# Patient Record
Sex: Female | Born: 1975 | Race: White | Hispanic: No | Smoking: Former smoker
Health system: Southern US, Community
[De-identification: ages and names within clinical notes are randomized; demographics above are authoritative.]

## PROBLEM LIST (undated history)

## (undated) DIAGNOSIS — T7840XA Allergy, unspecified, initial encounter: Secondary | ICD-10-CM

## (undated) DIAGNOSIS — F419 Anxiety disorder, unspecified: Secondary | ICD-10-CM

## (undated) DIAGNOSIS — N390 Urinary tract infection, site not specified: Secondary | ICD-10-CM

## (undated) DIAGNOSIS — Z973 Presence of spectacles and contact lenses: Secondary | ICD-10-CM

## (undated) DIAGNOSIS — D649 Anemia, unspecified: Secondary | ICD-10-CM

## (undated) DIAGNOSIS — E669 Obesity, unspecified: Secondary | ICD-10-CM

## (undated) DIAGNOSIS — K219 Gastro-esophageal reflux disease without esophagitis: Secondary | ICD-10-CM

## (undated) DIAGNOSIS — F329 Major depressive disorder, single episode, unspecified: Secondary | ICD-10-CM

## (undated) DIAGNOSIS — M549 Dorsalgia, unspecified: Secondary | ICD-10-CM

## (undated) DIAGNOSIS — Z01419 Encounter for gynecological examination (general) (routine) without abnormal findings: Secondary | ICD-10-CM

## (undated) DIAGNOSIS — Q829 Congenital malformation of skin, unspecified: Secondary | ICD-10-CM

## (undated) DIAGNOSIS — G8929 Other chronic pain: Secondary | ICD-10-CM

## (undated) DIAGNOSIS — J302 Other seasonal allergic rhinitis: Secondary | ICD-10-CM

## (undated) DIAGNOSIS — Z975 Presence of (intrauterine) contraceptive device: Secondary | ICD-10-CM

## (undated) HISTORY — DX: Anemia, unspecified: D64.9

## (undated) HISTORY — DX: Major depressive disorder, single episode, unspecified: F32.9

## (undated) HISTORY — DX: Other seasonal allergic rhinitis: J30.2

## (undated) HISTORY — DX: Gastro-esophageal reflux disease without esophagitis: K21.9

## (undated) HISTORY — DX: Dorsalgia, unspecified: M54.9

## (undated) HISTORY — DX: Urinary tract infection, site not specified: N39.0

## (undated) HISTORY — PX: KNEE ARTHROSCOPY: SUR90

## (undated) HISTORY — DX: Congenital malformation of skin, unspecified: Q82.9

## (undated) HISTORY — DX: Presence of spectacles and contact lenses: Z97.3

## (undated) HISTORY — DX: Presence of (intrauterine) contraceptive device: Z97.5

## (undated) HISTORY — DX: Encounter for gynecological examination (general) (routine) without abnormal findings: Z01.419

## (undated) HISTORY — DX: Obesity, unspecified: E66.9

## (undated) HISTORY — DX: Anxiety disorder, unspecified: F41.9

## (undated) HISTORY — DX: Allergy, unspecified, initial encounter: T78.40XA

## (undated) HISTORY — DX: Other chronic pain: G89.29

---

## 2001-10-25 ENCOUNTER — Emergency Department (HOSPITAL_COMMUNITY): Admission: EM | Admit: 2001-10-25 | Discharge: 2001-10-25 | Payer: Self-pay | Admitting: Emergency Medicine

## 2007-05-12 ENCOUNTER — Ambulatory Visit (HOSPITAL_COMMUNITY): Admission: RE | Admit: 2007-05-12 | Discharge: 2007-05-12 | Payer: Self-pay | Admitting: Family Medicine

## 2007-05-12 IMAGING — US US OB COMP LESS 14 WK
1 series · 14 of 28 positions shown · non-contrast
Comparison: none

OBSTETRICAL ULTRASOUND:

 This ultrasound exam was performed in the [HOSPITAL] Ultrasound Department.  The OB US report was generated in the AS system, and faxed to the ordering physician.  This report is also available in [REDACTED] PACS.

[Series 1: us ob comp less 14 wk · 0.18mm/px · 14 of 44 slices shown]
[im 2/44]
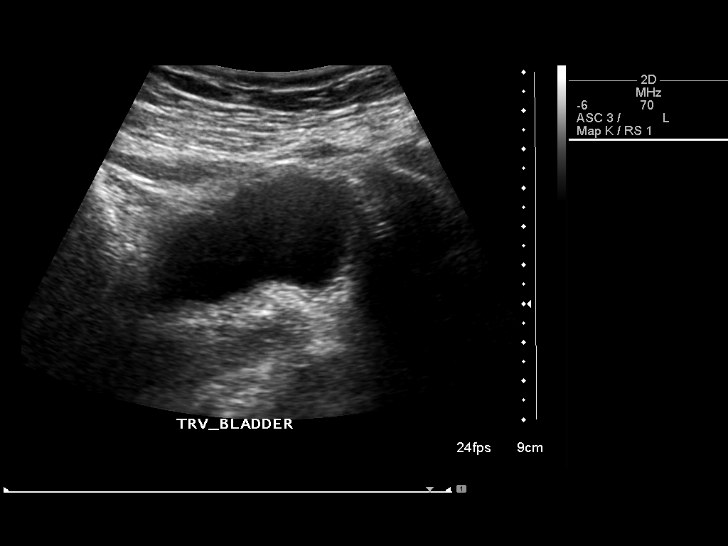
[im 5/44]
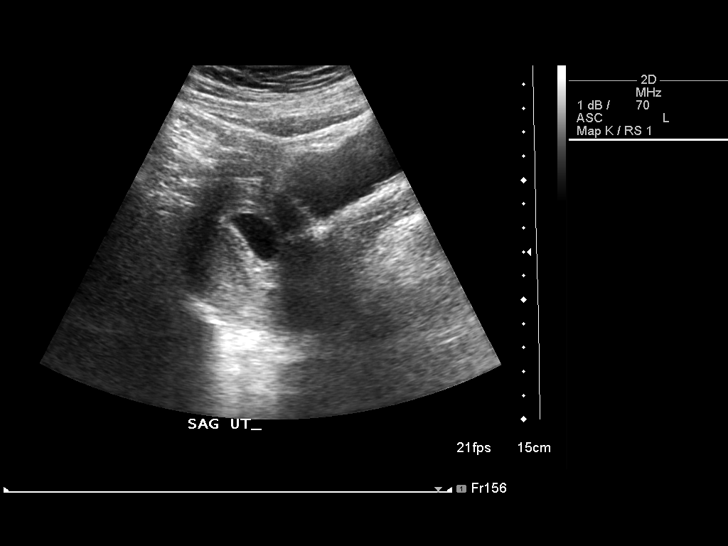
[im 8/44]
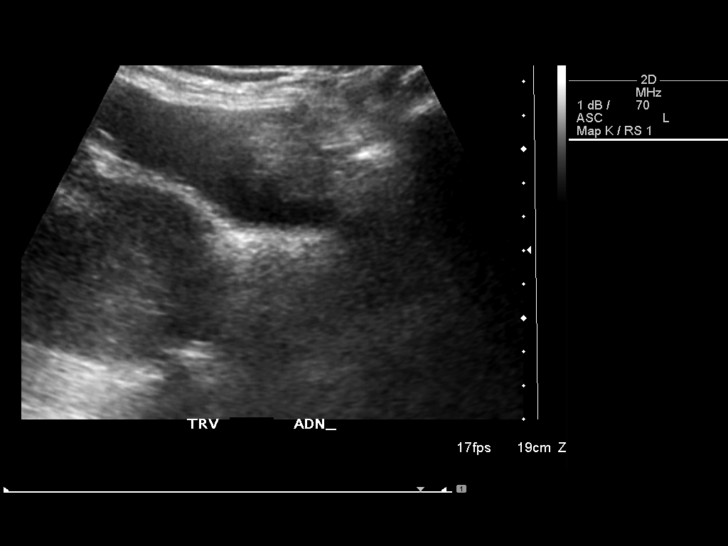
[im 12/44]
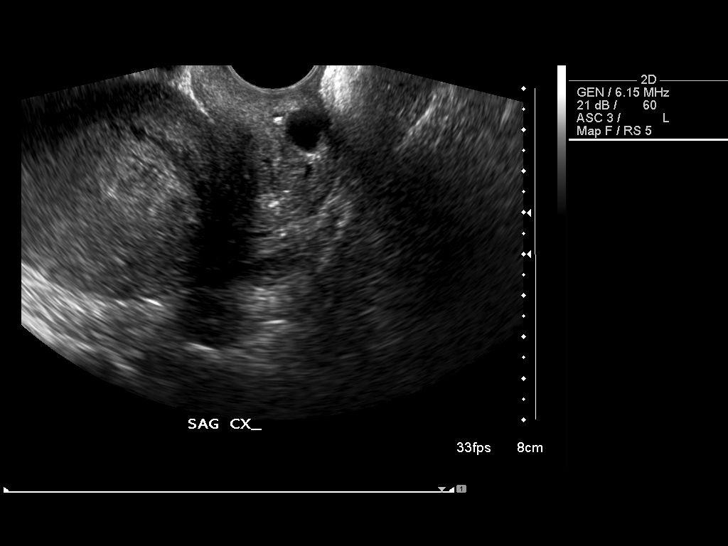
[im 15/44]
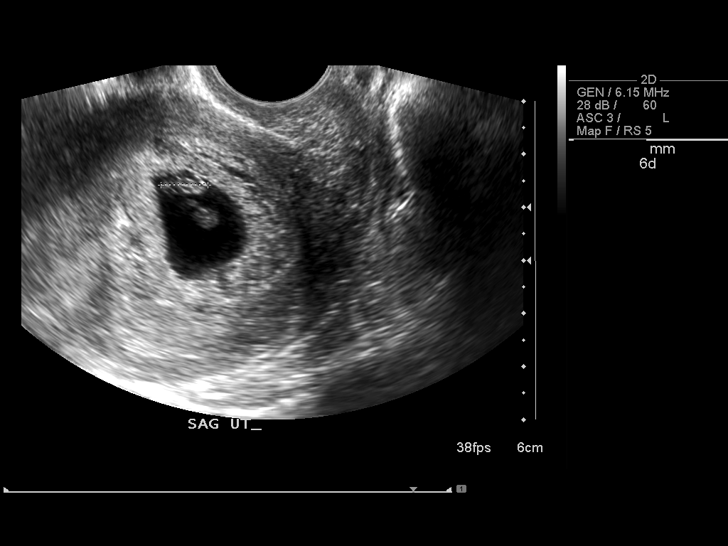
[im 18/44]
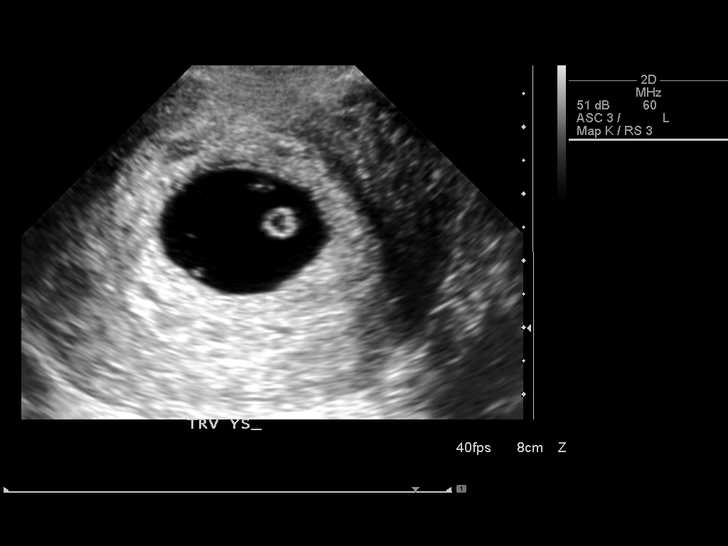
[im 21/44]
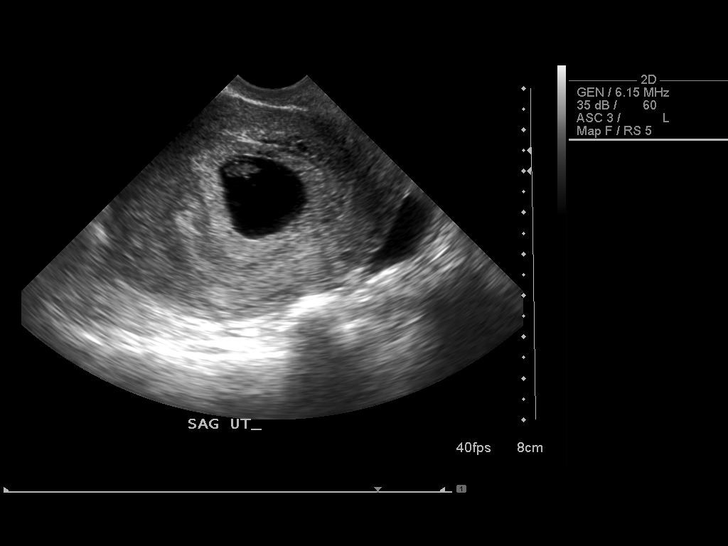
[im 24/44]
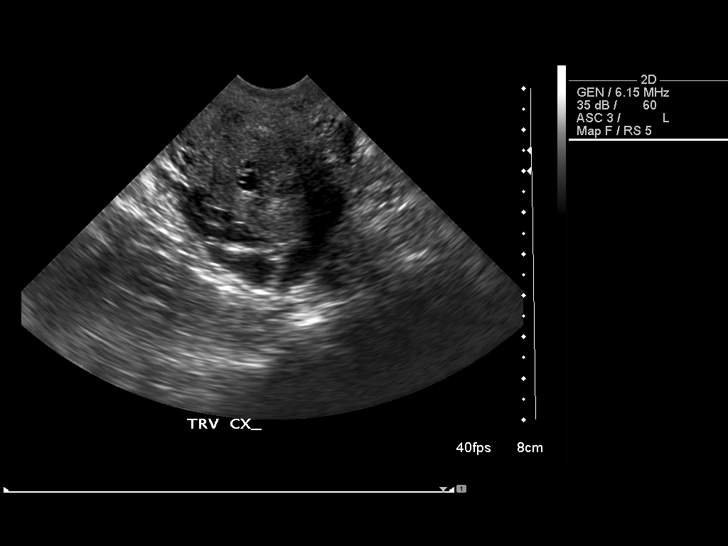
[im 28/44]
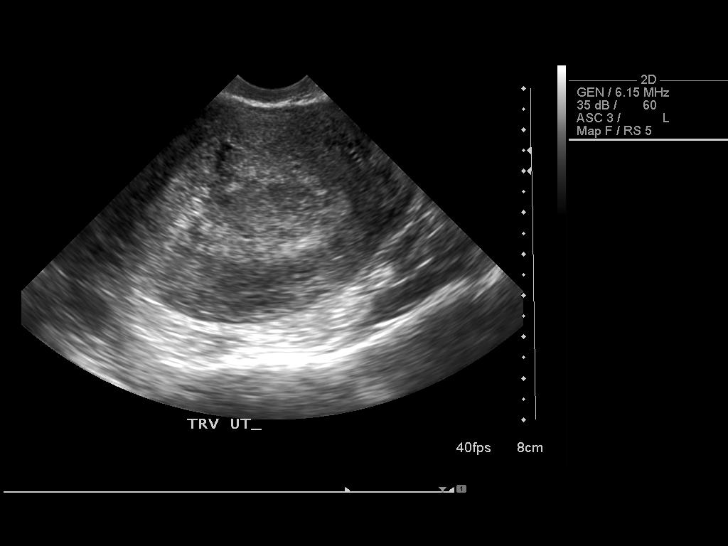
[im 31/44]
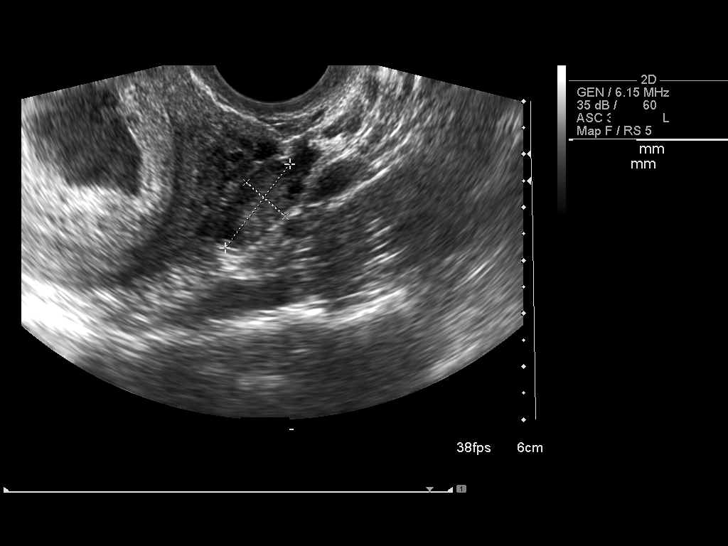
[im 34/44]
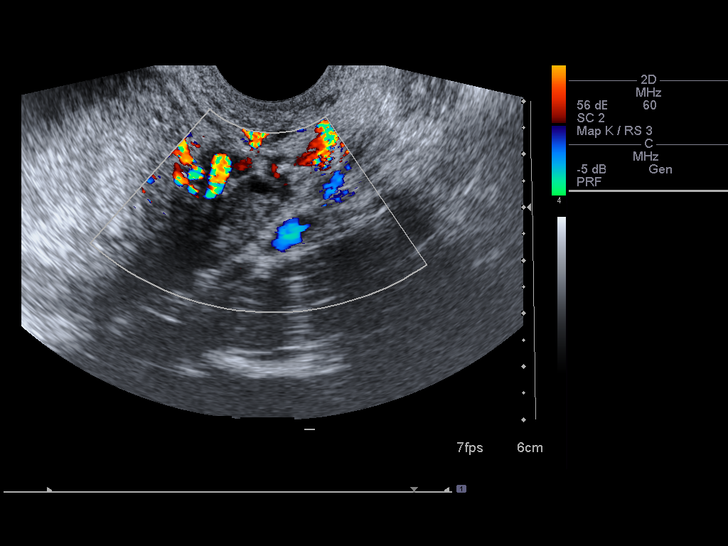
[im 37/44]
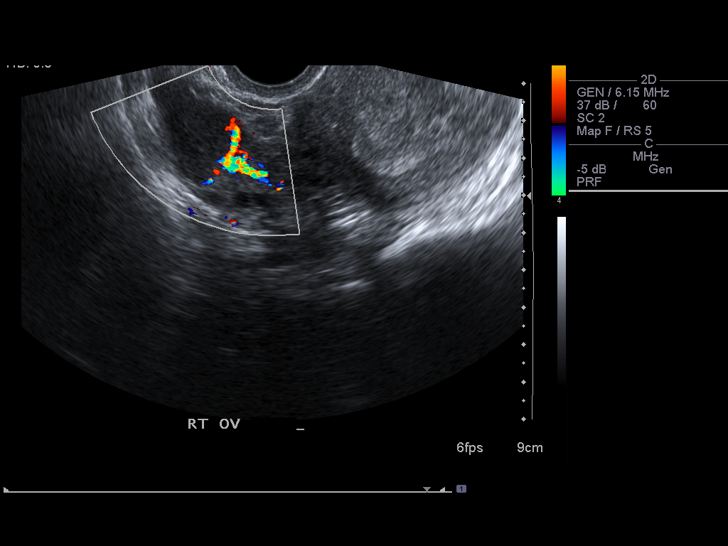
[im 40/44]
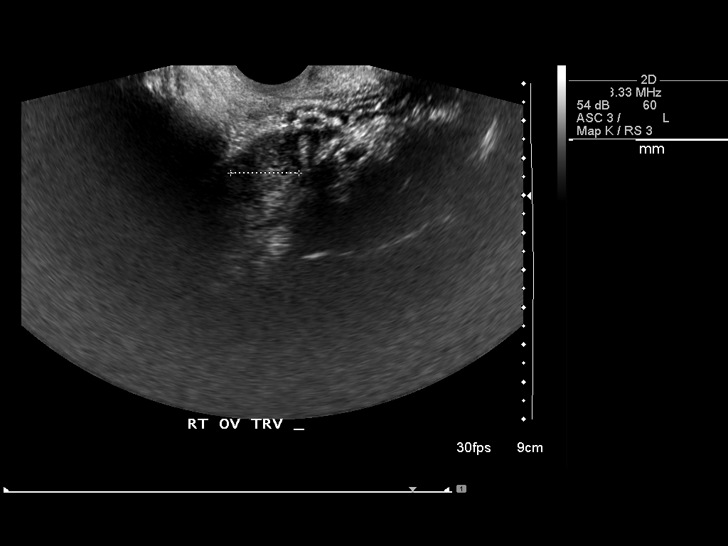
[im 44/44]
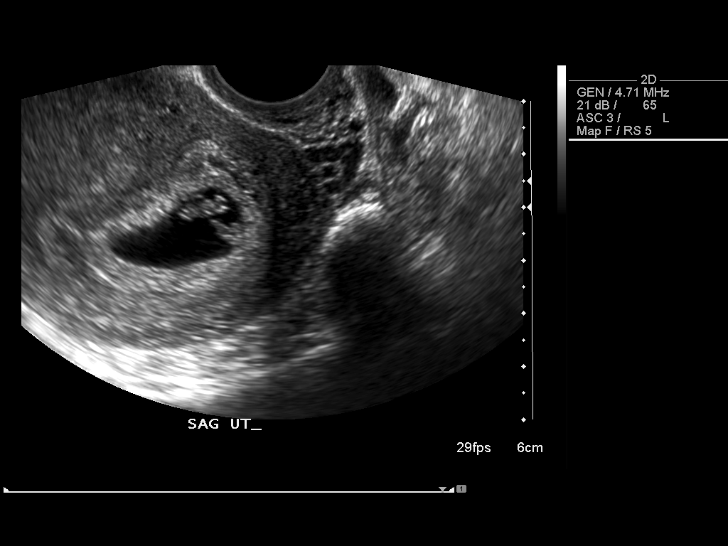

[14 of 28 positions shown; findings below may reference images not displayed]

IMPRESSION: See AS Obstetric US report.

## 2007-09-18 DIAGNOSIS — F32A Depression, unspecified: Secondary | ICD-10-CM

## 2007-09-18 HISTORY — DX: Depression, unspecified: F32.A

## 2008-01-13 ENCOUNTER — Inpatient Hospital Stay (HOSPITAL_COMMUNITY): Admission: RE | Admit: 2008-01-13 | Discharge: 2008-01-15 | Payer: Self-pay | Admitting: Obstetrics and Gynecology

## 2011-01-30 NOTE — Op Note (Signed)
NAMEKHRISTIAN, SEALS               ACCOUNT NO.:  [id number]   MEDICAL RECORD NO.:  [id number]          PATIENT TYPE:  INP   LOCATION:  9126                          FACILITY:  [facility]   PHYSICIAN:  Kendra H. Tenny Craw, MD     DATE OF BIRTH:  10/04/1975   DATE OF PROCEDURE:  01/13/2008  DATE OF DISCHARGE:                               OPERATIVE REPORT   PREOPERATIVE DIAGNOSES:  1. A 41 and 5-week intrauterine pregnancy.  2. Suspected macrosomia.  3. Unfavorable cervix.   POSTOPERATIVE DIAGNOSES:  1. A 41 and 5-week intrauterine pregnancy.  2. Confirmed macrosomia.  3. Unfavorable cervix.   PROCEDURE:  Primary low transverse cesarean section with a T-extension  of uterine incision and vacuum-assisted delivery of infant's head.   SURGEON:  Freddrick March. Tenny Craw, MD   ASSISTANT:  Lodema Hong, MD.   ANESTHESIA:  Spinal.   OPERATIVE FINDINGS:  Vigorous female infant in the vertex presentation  weighing 10 pounds, 1 ounce with Apgar scores of 8 at one and 9 at five  minutes.  Normal-appearing ovaries, tubes, and uterus.  A benign cyst of  Morgagni was noted on the left adnexa.   SPECIMEN:  Placenta.   DISPOSITION:  Placenta tube L&D for disposal.   ESTIMATED BLOOD LOSS:  700 mL.   IV FLUIDS:  2900 mL.   URINE OUTPUT:  100 mL of clear urine.   COMPLICATIONS:  None.   DETAILS OF PROCEDURE:  Lindsey Avila is a 35 year old G1 P0, who presents  today at 68 weeks and 5 days' estimated gestational age for primary low  transverse cesarean section, for suspected macrosomia, and unfavorable  cervix.  One week ago, she had an ultrasound in the office, which  demonstrated an estimated fetal weight of 3979 g or 8 pounds, 12 ounces  at 41 weeks.  At this time, her cervix was closed.  In a long discussion  about induction of labor versus primary cesarean section, the patient  elected to proceed with a primary cesarean section and this was  scheduled for today.  The risks, benefits, and alternatives of  primary  cesarean section versus labor induction were discussed.   Following the appropriate informed consent, the patient was brought to  the operating room where spinal anesthesia was administered and found to  be adequate.  She was placed in the dorsal supine position in a leftward  tilt, prepped and draped in the normal sterile fashion.  A scalpel was  used to make a Pfannenstiel skin incision, which was carried down  through the underlying layers of soft tissue to the fascia.  The fascia  was incised in the midline, fascial incision was extended laterally with  Mayo scissors.  The superior aspect of the fascial incision was grasped  with Kocher clamps x2 and tented up.  The underlying rectus muscle was  dissected off sharply with the electrocautery unit.  The same procedure  was repeated on the inferior aspect of the fascial incision.  The rectus  muscles were then separated in the midline.  The abdominal peritoneum  was identified, tented up, entered  sharply with the Metzenbaums, and the  incision was extended superiorly and inferiorly with good visualization  of the bladder.  The bladder blade was inserted.  The vesicouterine  peritoneum was identified, tented up, entered sharply with the  Metzenbaums.  The incision was then extended laterally with the  Metzenbaums, and the bladder flap was then created digitally.  The  bladder blade was then reinserted.  The scalpel was used to make a low  transverse incision on the uterus, which was extended laterally with  blunt dissection.  The vacuum was then used to assist with delivery of  the infant's head.  After two pop-offs, the uterine incision was  extended with bandage scissors and following this, the infant's head was  easily delivered followed by the body.  The infant was bulb suctioned on  the operative field and cried vigorously on the operative field.  Cord  was clamped and cut, and the infant was passed to the waiting   pediatricians.  The placenta was then spontaneously delivered.  The  uterus was exteriorized, cleared of all clot and debris.  Upon  inspection of the uterine incision, it was felt that the extension of  the uterine incision with the bandage scissors appeared to be a T.  The  T portion of the uterine incision was repaired with a #1 chromic in two  layers.  The low transverse incision was then repaired with #1 chromic  in a running locked fashion.  Given the thickness of the myometrium, a  single layer repair was performed as imbricating layer could not be  performed.  A 3-0 Vicryl was used to perform a baseball stitch closure  of the uterine serosa on the extended portion of the uterine incision.  The uterine incision was then inspected and found to be hemostatic.  The  ovaries and tubes were inspected and found to be within normal limits.  A benign cyst of Morgagni was noted on the left adnexa.  The uterus was  returned to the abdominal cavity.  The abdominal cavity was cleared of  all clot and debris.  The uterine incision was reinspected again,  confirmed to be hemostatic.  The abdominal peritoneum was then  reapproximated with 2-0 Vicryl in a running fashion.  The fascia was  closed with 0 looped PDS and the skin was closed with staples.  All  sponge, lap, and needle counts were correct x2.  The patient tolerated  the procedure well and was brought to the recovery room in stable  condition.  At the completion of this case, the patient was counseled  that given the T-extension of the incision, that all future babies  should be performed by cesarean section, and she should not undergo a  trial of labor.       Freddrick March. Tenny Craw, MD  Electronically Signed     KHR/MEDQ  D:  01/13/2008  T:  01/14/2008  Job:  [id number]

## 2011-01-30 NOTE — H&P (Signed)
NAMEMARIALIZ, Lindsey Avila               ACCOUNT NO.:  [id number]   MEDICAL RECORD NO.:  [id number]          PATIENT TYPE:  AMB   LOCATION:  SDC                           FACILITY:  [facility]   PHYSICIAN:  Kendra H. Tenny Craw, MD     DATE OF BIRTH:  1975-10-08   DATE OF ADMISSION:  DATE OF DISCHARGE:                              HISTORY & PHYSICAL   CHIEF COMPLAINT:  Post dates pregnancy, suspected macrosomia and  unfavorable cervix.   HISTORY OF PRESENT ILLNESS:  [name] is a 35 year old gravida 1, para  0 at 41 weeks and 5 days of pregnancy who presents for a primary low  transverse cesarean section for post-term pregnancy was suspected  macrosomia and an unfavorable cervix.  She underwent a transabdominal  ultrasound on January 07, 2008, for size greater than dates which  demonstrate an estimated fetal weight of 3979 grams or 8 pounds and 12  ounces and an amniotic fluid index of 14.2 cm.  Her cervix was not  dilated at that time, and NST was reactive at that time.  She was  approximately 40 weeks and 6 days on the date of that ultrasound.  In  long discussions with [name] regarding options of labor induction  versus primary cesarean section after the risks, benefits and  alternatives of each were discussed with her, she elected to proceed  with primary cesarean section, understanding that if she did happen to  go into labor spontaneously on her own prior to April 28, that she would  attempt a trial of labor.  She presents on January 13, 2007, for a  scheduled primary cesarean section.   PAST MEDICAL HISTORY:  Anemia.   PAST SURGICAL HISTORY:  1. Left knee arthroscopy.  2. Wisdom tooth removal.   PAST OBSTETRICAL HISTORY:  She is a gravida 1, para 0.   PAST GYNECOLOGY HISTORY:  Denies abnormalities with past smears or  sexually transmitted diseases.   SOCIAL HISTORY:  Negative x3.   FAMILY HISTORY:  She has an uncle with a history of an abnormally formed  arm.  No mental retardation.   No spina bifida.  No cystic fibrosis.  No  Jewish descent.   CURRENT MEDICATIONS:  Iron and prenatal vitamins.   ALLERGIES:  LATEX, PENICILLIN AND SULFA.   PRENATAL LABS:  Blood type A+, antibody negative, RPR nonreactive,  rubella immune, hepatitis B surface antigen negative, HIV negative.  First trimester genetic screen was within normal limits as was a second  trimester AFP.  One hour Glucola of 93.  Group B strep was negative.  Pap smear, gonorrhea and chlamydia were within normal limits and  negative the   PHYSICAL EXAMINATION:  VITAL SIGNS:  Weight 227 pounds, blood pressure  110/70, height 5 feet 6 inches, alert and oriented x3.  GENERAL:  No apparent distress.  ABDOMEN:  Soft, gravid, nontender, nondistended.  Fetal heart tones were  131 in the office.  Cervix was closed, thick and high.   ASSESSMENT AND PLAN:  [name] is a 35 year old G1, P0 who presents  for primary cesarean section for  suspected macrosomia post dates and an  unfavorable cervix.  1. Admit to the hospital.  2. Consent for primary cesarean section.  3. Obtain CBC, PT/PTT, INR type and screen prior to surgery.  4. Administer clindamycin 900 mg IV prior to skin incision.      Freddrick March. Tenny Craw, MD  Electronically Signed     KHR/MEDQ  D:  01/09/2008  T:  01/09/2008  Job:  [id number]

## 2011-02-02 NOTE — Discharge Summary (Signed)
Lindsey Avila, Lindsey Avila               ACCOUNT NO.:  [id number]   MEDICAL RECORD NO.:  [id number]          PATIENT TYPE:  INP   LOCATION:  9126                          FACILITY:  [facility]   PHYSICIAN:  Kendra H. Tenny Craw, MD     DATE OF BIRTH:  [birth year]   DATE OF ADMISSION:  01/13/2008  DATE OF DISCHARGE:  01/15/2008                               DISCHARGE SUMMARY   FINAL DIAGNOSES:  Intrauterine pregnancy at 41-5/7 weeks, macrosomia,  and unfavorable cervix.   PROCEDURE:  Primary low transverse cesarean section with T-extension of  uterine incision and vacuum-assisted delivery of infant's head.   SURGEON:  Freddrick March. Tenny Craw, MD.   ASSISTANT:  Dr. Lodema Hong.   COMPLICATIONS:  None.   This 35 year old, G1, P0, presents at 41-5/7 weeks' gestation secondary  to suspected macrosomia and unfavorable cervix.  The patient had an  ultrasound a week prior in the office showing an estimated fetal weight  of close to 4000 grams and at that time her cervix was closed and a long  discussion was held with the patient regarding her options and the  patient elected to proceed with a cesarean section secondary to these  findings.  The patient's antepartum course up to this point had been  uncomplicated.  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  She did have a negative  group B strep culture obtained in our office at 35 weeks, otherwise  uncomplicated antepartum course with no gestational diabetes.  The  patient was taken to the operating room on January 13, 2008, by Dr. Waynard Reeds where a primary low transverse cesarean section with a T-extension  was performed with the delivery of a 10-pound-1-ounce female infant with  Apgars of 8 and 9.  There was a benign cyst at around left adnexa,  otherwise delivery went without complications.  The patient's  postoperative course was benign without any significant fevers.  The  patient did want her little  boy circumcised prior to discharge, which  was performed.  The patient was felt ready for discharge on  postoperative day #2, was sent home on a regular diet, told to decrease  activities, told to continue her prenatal vitamins, was given Percocet 1-  2 every 4-6 hours as needed for her pain, was to follow up in our office  the next day for her staple removal.   LABS ON DISCHARGE:  The patient had a hemoglobin of 11.2, white blood  cell count of 9.8, and platelets of 110,000.     Leilani Able, P.A.-C.      Freddrick March. Tenny Craw, MD  Electronically Signed   MB/MEDQ  D:  02/05/2008  T:  02/06/2008  Job:  [id number]

## 2011-06-12 LAB — CBC
HCT: 31.4 — ABNORMAL LOW
MCHC: 35.1
MCV: 86.6
MCV: 86.8
Platelets: 110 — ABNORMAL LOW
RBC: 3.62 — ABNORMAL LOW
RDW: 14
WBC: 9.8

## 2011-06-12 LAB — CCBB MATERNAL DONOR DRAW

## 2011-06-12 LAB — PROTIME-INR
INR: 0.9
Prothrombin Time: 12.3

## 2011-06-12 LAB — ABO/RH: ABO/RH(D): A POS

## 2014-10-28 ENCOUNTER — Telehealth: Payer: Self-pay | Admitting: Medical

## 2014-10-28 ENCOUNTER — Encounter: Payer: Self-pay | Admitting: Medical

## 2014-10-28 ENCOUNTER — Ambulatory Visit (INDEPENDENT_AMBULATORY_CARE_PROVIDER_SITE_OTHER): Payer: 59 | Admitting: Medical

## 2014-10-28 ENCOUNTER — Telehealth: Payer: Self-pay | Admitting: Family Medicine

## 2014-10-28 VITALS — BP 110/70 | HR 68 | Temp 98.2°F | Resp 15 | Ht 66.0 in | Wt 204.0 lb

## 2014-10-28 DIAGNOSIS — Z7189 Other specified counseling: Secondary | ICD-10-CM

## 2014-10-28 DIAGNOSIS — Z0000 Encounter for general adult medical examination without abnormal findings: Secondary | ICD-10-CM

## 2014-10-28 DIAGNOSIS — G8929 Other chronic pain: Secondary | ICD-10-CM

## 2014-10-28 DIAGNOSIS — E669 Obesity, unspecified: Secondary | ICD-10-CM

## 2014-10-28 DIAGNOSIS — Z7185 Encounter for immunization safety counseling: Secondary | ICD-10-CM

## 2014-10-28 DIAGNOSIS — M549 Dorsalgia, unspecified: Secondary | ICD-10-CM

## 2014-10-28 DIAGNOSIS — L989 Disorder of the skin and subcutaneous tissue, unspecified: Secondary | ICD-10-CM

## 2014-10-28 LAB — POCT URINALYSIS DIPSTICK
BILIRUBIN UA: NEGATIVE
GLUCOSE UA: NEGATIVE
KETONES UA: NEGATIVE
LEUKOCYTES UA: NEGATIVE
Nitrite, UA: NEGATIVE
PH UA: 7
Protein, UA: NEGATIVE
RBC UA: NEGATIVE
SPEC GRAV UA: 1.02
Urobilinogen, UA: NEGATIVE

## 2014-10-28 NOTE — Telephone Encounter (Signed)
I would have her try Benadryl at bedtime for the next 2-3 wk if not already doing an allergy pill.

## 2014-10-28 NOTE — Telephone Encounter (Signed)
Pt states she forgot to mention at her appt today that she's had a sore throat for three weeks. She wants to know what can be done for this.

## 2014-10-28 NOTE — Telephone Encounter (Signed)
Notified patient.

## 2014-10-28 NOTE — Patient Instructions (Signed)
Thank you for giving me the opportunity to serve you today.   Your diagnoses today includes: Encounter Diagnoses  Name Primary?  . Encounter for health maintenance examination in adult Yes  . Obesity   . Chronic back pain   . Changing skin lesion   . Vaccine counseling     Specific recommendations today include See your eye doctor yearly for routine vision care. See your dentist yearly for routine dental care including hygiene visits twice yearly. See your gynecologist yearly for routine gynecological care. Exercise most days per week, preferably at least 30 minutes every day Get me contact info for the dermatologist you want to use Diet: Eat a healthy low fat diet, including fruits and vegetables If you eat meat, choose lean meats such as fish, [place], and chicken consume appropriate quantities of whole grains such as barley, oat meal, and whole grain bread Dairy can include low fat milk, yogurt, cottage cheese, other cheese in small quantities Limit or avoid fast food, fried foods, sweets, ice cream, alcohol, soda, and candy  Vaccine recommendations: Check insurance coverage for Tdap vaccine.  Please follow up pending labs  I have included other useful information below for your review.  Preventative Care for Adults - Female      MAINTAIN REGULAR HEALTH EXAMS:  A routine yearly physical is a good way to check in with your primary care provider about your health and preventive screening. It is also an opportunity to share updates about your health and any concerns you have, and receive a thorough all-over exam.   Most health insurance companies pay for at least some preventative services.  Check with your health plan for specific coverages.  WHAT PREVENTATIVE SERVICES DO WOMEN NEED?  Adult women should have their weight and blood pressure checked regularly.   Women age 65 and older should have their cholesterol levels checked regularly.  Women should be screened for  cervical cancer with a Pap smear and pelvic exam beginning at either age 57, or 3 years after they become sexually activity.    Breast cancer screening generally begins at age 83 with a mammogram and breast exam by your primary care provider.    Beginning at age 30 and continuing to age 42, women should be screened for colorectal cancer.  Certain people may need continued testing until age 52.  Updating vaccinations is part of preventative care.  Vaccinations help protect against diseases such as the flu.  Osteoporosis is a disease in which the bones lose minerals and strength as we age. Women ages 54 and over should discuss this with their caregivers, as should women after menopause who have other risk factors.  Lab tests are generally done as part of preventative care to screen for anemia and blood disorders, to screen for problems with the kidneys and liver, to screen for bladder problems, to check blood sugar, and to check your cholesterol level.  Preventative services generally include counseling about diet, exercise, avoiding tobacco, drugs, excessive alcohol consumption, and sexually transmitted infections.    GENERAL RECOMMENDATIONS FOR GOOD HEALTH:  Healthy diet:  Eat a variety of foods, including fruit, vegetables, animal or vegetable protein, such as meat, fish, chicken, and eggs, or beans, lentils, tofu, and grains, such as rice.  Drink plenty of water daily.  Decrease saturated fat in the diet, avoid lots of red meat, processed foods, sweets, fast foods, and fried foods.  Exercise:  Aerobic exercise helps maintain good heart health. At least 30-40 minutes of moderate-intensity  exercise is recommended. For example, a brisk walk that increases your heart rate and breathing. This should be done on most days of the week.   Find a type of exercise or a variety of exercises that you enjoy so that it becomes a part of your daily life.  Examples are running, walking, swimming, water  aerobics, and biking.  For motivation and support, explore group exercise such as aerobic class, spin class, Zumba, Yoga,or  martial arts, etc.    Set exercise goals for yourself, such as a certain weight goal, walk or run in a race such as a 5k walk/run.  Speak to your primary care provider about exercise goals.  Disease prevention:  If you smoke or chew tobacco, find out from your caregiver how to quit. It can literally save your life, no matter how long you have been a tobacco user. If you do not use tobacco, never begin.   Maintain a healthy diet and normal weight. Increased weight leads to problems with blood pressure and diabetes.   The Body Mass Index or BMI is a way of measuring how much of your body is fat. Having a BMI above 27 increases the risk of heart disease, diabetes, hypertension, stroke and other problems related to obesity. Your caregiver can help determine your BMI and based on it develop an exercise and dietary program to help you achieve or maintain this important measurement at a healthful level.  High blood pressure causes heart and blood vessel problems.  Persistent high blood pressure should be treated with medicine if weight loss and exercise do not work.   Fat and cholesterol leaves deposits in your arteries that can block them. This causes heart disease and vessel disease elsewhere in your body.  If your cholesterol is found to be high, or if you have heart disease or certain other medical conditions, then you may need to have your cholesterol monitored frequently and be treated with medication.   Ask if you should have a cardiac stress test if your history suggests this. A stress test is a test done on a treadmill that looks for heart disease. This test can find disease prior to there being a problem.  Menopause can be associated with physical symptoms and risks. Hormone replacement therapy is available to decrease these. You should talk to your caregiver about whether  starting or continuing to take hormones is right for you.   Osteoporosis is a disease in which the bones lose minerals and strength as we age. This can result in serious bone fractures. Risk of osteoporosis can be identified using a bone density scan. Women ages 69 and over should discuss this with their caregivers, as should women after menopause who have other risk factors. Ask your caregiver whether you should be taking a calcium supplement and Vitamin D, to reduce the rate of osteoporosis.   Avoid drinking alcohol in excess (more than two drinks per day).  Avoid use of street drugs. Do not share needles with anyone. Ask for professional help if you need assistance or instructions on stopping the use of alcohol, cigarettes, and/or drugs.  Brush your teeth twice a day with fluoride toothpaste, and floss once a day. Good oral hygiene prevents tooth decay and gum disease. The problems can be painful, unattractive, and can cause other health problems. Visit your dentist for a routine oral and dental check up and preventive care every 6-12 months.   Look at your skin regularly.  Use a mirror to look  at your back. Notify your caregivers of changes in moles, especially if there are changes in shapes, colors, a size larger than a pencil eraser, an irregular border, or development of new moles.  Safety:  Use seatbelts 100% of the time, whether driving or as a passenger.  Use safety devices such as hearing protection if you work in environments with loud noise or significant background noise.  Use safety glasses when doing any work that could send debris in to the eyes.  Use a helmet if you ride a bike or motorcycle.  Use appropriate safety gear for contact sports.  Talk to your caregiver about gun safety.  Use sunscreen with a SPF (or skin protection factor) of 15 or greater.  Lighter skinned people are at a greater risk of skin cancer. Don't forget to also wear sunglasses in order to protect your eyes from  too much damaging sunlight. Damaging sunlight can accelerate cataract formation.   Practice safe sex. Use condoms. Condoms are used for birth control and to help reduce the spread of sexually transmitted infections (or STIs).  Some of the STIs are gonorrhea (the clap), chlamydia, syphilis, trichomonas, herpes, HPV (human papilloma virus) and HIV (human immunodeficiency virus) which causes AIDS. The herpes, HIV and HPV are viral illnesses that have no cure. These can result in disability, cancer and death.   Keep carbon monoxide and smoke detectors in your home functioning at all times. Change the batteries every 6 months or use a model that plugs into the wall.   Vaccinations:  Stay up to date with your tetanus shots and other required immunizations. You should have a booster for tetanus every 10 years. Be sure to get your flu shot every year, since 5%-20% of the [place] population comes down with the flu. The flu vaccine changes each year, so being vaccinated once is not enough. Get your shot in the fall, before the flu season peaks.   Other vaccines to consider:  Human Papilloma Virus or HPV causes cancer of the cervix, and other infections that can be transmitted from person to person. There is a vaccine for HPV, and females should get immunized between the ages of 16 and 74. It requires a series of 3 shots.   Pneumococcal vaccine to protect against certain types of pneumonia.  This is normally recommended for adults age 66 or older.  However, adults younger than 41 years old with certain underlying conditions such as diabetes, heart or lung disease should also receive the vaccine.  Shingles vaccine to protect against Varicella Zoster if you are older than age 2, or younger than 41 years old with certain underlying illness.  Hepatitis A vaccine to protect against a form of infection of the liver by a virus acquired from food.  Hepatitis B vaccine to protect against a form of infection of the  liver by a virus acquired from blood or body fluids, particularly if you work in health care.  If you plan to travel internationally, check with your local health department for specific vaccination recommendations.  Cancer Screening:  Breast cancer screening is essential to preventive care for women. All women age 9 and older should perform a breast self-exam every month. At age 10 and older, women should have their caregiver complete a breast exam each year. Women at ages 52 and older should have a mammogram (x-ray film) of the breasts. Your caregiver can discuss how often you need mammograms.    Cervical cancer screening includes taking a Pap  smear (sample of cells examined under a microscope) from the cervix (end of the uterus). It also includes testing for HPV (Human Papilloma Virus, which can cause cervical cancer). Screening and a pelvic exam should begin at age 41, or 3 years after a woman becomes sexually active. Screening should occur every year, with a Pap smear but no HPV testing, up to age 41. After age 41, you should have a Pap smear every 3 years with HPV testing, if no HPV was found previously.   Most routine colon cancer screening begins at the age of 41. On a yearly basis, doctors may provide special easy to use take-home tests to check for hidden blood in the stool. Sigmoidoscopy or colonoscopy can detect the earliest forms of colon cancer and is life saving. These tests use a small camera at the end of a tube to directly examine the colon. Speak to your caregiver about this at age 41, when routine screening begins (and is repeated every 5 years unless early forms of pre-cancerous polyps or small growths are found).

## 2014-10-28 NOTE — Telephone Encounter (Signed)
Changed pcp [facility]

## 2014-10-28 NOTE — Addendum Note (Signed)
Addended by: Lilli LightLOMAX, Livi Mcgann G on: 10/28/2014 12:14 PM   Modules accepted: Orders

## 2014-10-28 NOTE — Progress Notes (Signed)
Subjective:   HPI  Lindsey Avila is a 39 y.o. female who presents for a complete physical.  New patient today.  I saw her boyfriend recently for cough as a new patient.  Medical care team includes:  [facility]endra Ross [facility]  Optometry  Kristian CoveyShane Aaira Oestreicher, PA-C primary care establishing here today   Preventative care: Last ophthalmology visit:2015, [facility] Last dental visit:years ago dentist retired Last colonoscopy:never Last mammogram:2015 Last gynecological exam: 07/2014 Dr Tenny Crawoss, up to date on pap  Last ZOX:WRUEAEKG:never Last labs:years ago  Prior vaccinations: TD or Tdap:unsure? [facility] [place]? Influenza:2015 Pneumococcal:no Shingles/Zostavax:no  Advanced directive:yes Health care power of attorney:yes Living will:yes  Concerns: Can't seem to lose weight despite efforts over the years .  Eats healthy, exercising 4 days per week.   has been on appetite suppressant once before without side effects.  Doesn't sleep well.  In general gets in the bed 11:30pm, awakes around 6am to get son off to school.  Snores some.  Not rested when she awakes.   Reviewed their medical, surgical, family, social, medication, and allergy history and updated chart as appropriate.  Past Medical History  Diagnosis Date  . Seasonal allergies   . Recurrent urinary tract infection     no prior urology consult  . Anemia     in remote past due to heavy periods  . IUD (intrauterine device) in place     Mirena, placed 02/2014 (second one)  . Skin anomaly     keratinosis  . Wears glasses   . Chronic back pain   . Obesity   . Depression 2009    prior with pregnancy  . Encounter for routine gynecological examination     Dr. Waynard ReedsKendra Ross    Past Surgical History  Procedure Laterality Date  . Knee arthroscopy      left    History   Social History  . Marital Status: Married    Spouse Name: N/A  . Number of Children: N/A  . Years of Education: N/A   Occupational History  . Not on file.    Social History Main Topics  . Smoking status: Never Smoker   . Smokeless tobacco: Not on file  . Alcohol Use: 1.8 oz/week    2 Glasses of wine, 1 Shots of liquor, 0 Standard drinks or equivalent per week  . Drug Use: No  . Sexual Activity: Not on file   Other Topics Concern  . Not on file   Social History Narrative   Lives with 6yo son, cat and dog.  Boyfriend x 1 year.   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  Exercise - 4 days per week, gym with treadmill, cardio, circuit training    Family History  Problem Relation Age of Onset  . Hypertension Mother   . Hypothyroidism Mother   . Obesity Mother   . Cancer Mother     basal cell  . Hypertension Father   . Cancer Father     basal cell  . Benign prostatic hyperplasia Father   . COPD Maternal Grandmother   . Asthma Maternal Grandmother   . Dementia Maternal Grandfather   . Cancer Paternal Grandmother     pancreatic  . Heart disease Paternal Grandfather   . Hypertension Paternal Grandfather   . Stroke Neg Hx   . Diabetes Neg Hx     No current outpatient prescriptions on file.  Allergies  Allergen Reactions  . Penicillins   . Sulfa Antibiotics  Review of Systems Constitutional: -fever, -chills, -sweats, -unexpected weight change, -decreased appetite, +fatigue Allergy: -sneezing, -itching, -congestion Dermatology: +changing moles, --rash, -lumps ENT: -runny nose, -ear pain, +sore throat, -hoarseness, -sinus pain, -teeth pain, - ringing in ears, -hearing loss, -nosebleeds Cardiology: -chest pain, -palpitations, -swelling, -difficulty breathing when lying flat, -waking up short of breath Respiratory: -cough, -shortness of breath, -difficulty breathing with exercise or exertion, -wheezing, -coughing up blood Gastroenterology: -abdominal pain, -nausea, -vomiting, -diarrhea, -constipation, -blood in stool, -changes in bowel movement, -difficulty swallowing or eating Hematology: -bleeding, -bruising   Musculoskeletal: +joint aches, -muscle aches, -joint swelling, +back pain, -neck pain, -cramping, -changes in gait Ophthalmology: denies vision changes, eye redness, itching, discharge Urology: -burning with urination, -difficulty urinating, -blood in urine, -urinary frequency, -urgency, -incontinence Neurology: -headache, -weakness, -tingling, -numbness, -memory loss, -falls, -dizziness Psychology: -depressed mood, -agitation, -sleep problems     Objective:   Physical Exam  BP 110/70 mmHg  Pulse 68  Temp(Src) 98.2 F (36.8 C) (Oral)  Resp 15  Ht  (1.676 m)  Wt 204 lb (92.534 kg)  BMI 32.94 kg/m2  General appearance: alert, no distress, WD/WN, obese white female Skin: tattoo left medial lower leg just proximal to ankle, left neck just under jaw anteriorly with 3-34mm raised erythematous lesion with some pearly coloration suggestive of possible BCC, few scattered macules, no other worrisome lesions HEENT: normocephalic, conjunctiva/corneas normal, sclerae anicteric, PERRLA, EOMi, nares patent, no discharge or erythema, pharynx normal Oral cavity: MMM, tongue normal, teeth in good repair Neck: supple, no lymphadenopathy, no thyromegaly, no masses, normal ROM, no bruits Chest: non tender, normal shape and expansion Heart: RRR, normal S1, S2, no murmurs Lungs: CTA bilaterally, no wheezes, rhonchi, or rales Abdomen: +bs, soft, non tender, non distended, no masses, no hepatomegaly, no splenomegaly, no bruits Back: non tender, normal ROM, no scoliosis Musculoskeletal: small surgical scars left knee,upper extremities non tender, no obvious deformity, normal ROM throughout, lower extremities non tender, no obvious deformity, normal ROM throughout Extremities: no edema, no cyanosis, no clubbing Pulses: 2+ symmetric, upper and lower extremities, normal cap refill Neurological: alert, oriented x 3, CN2-12 intact, strength normal upper extremities and lower extremities, sensation normal  throughout, DTRs 2+ throughout, no cerebellar signs, gait normal Psychiatric: normal affect, behavior normal, pleasant  Breast/gyn/rectal - deferred to gynecology    Assessment and Plan :    Encounter Diagnoses  Name Primary?  . Encounter for health maintenance examination in adult Yes  . Obesity   . Chronic back pain   . Changing skin lesion   . Vaccine counseling     Physical exam - discussed healthy lifestyle, diet, exercise, preventative care, vaccinations, and addressed their concerns.  Handout given. Obesity - discussed possibly using phentermine, risks/benefits, discussed diet, exercise, strategies to lose weight, good sleep hygiene.  May need to consider sleep apnea screen going forward.  Chronic back pain - discussed stretching, routine exercise, weight loss efforts Changing skin lesion - advised dermatology referral.  She will get [place] contact info for her parent's dermatologist Vaccine counseling - up to date on flu shot.  She will check insurance coverage regarding Tdap. Follow-up pending labs

## 2014-10-29 ENCOUNTER — Other Ambulatory Visit: Payer: Self-pay | Admitting: Medical

## 2014-10-29 LAB — TSH: TSH: 2.12 u[IU]/mL (ref 0.350–4.500)

## 2014-10-29 LAB — LIPID PANEL
CHOL/HDL RATIO: 3.2 ratio
CHOLESTEROL: 158 mg/dL (ref 0–200)
HDL: 49 mg/dL (ref >39)
LDL Cholesterol: 95 mg/dL (ref 0–99)
TRIGLYCERIDES: 69 mg/dL (ref <150)
VLDL: 14 mg/dL (ref 0–40)

## 2014-10-29 LAB — CBC
HEMATOCRIT: 41.7 % (ref 36.0–46.0)
HEMOGLOBIN: 13.9 g/dL (ref 12.0–15.0)
MCH: 28.5 pg (ref 26.0–34.0)
MCHC: 33.3 g/dL (ref 30.0–36.0)
MCV: 85.6 fL (ref 78.0–100.0)
MPV: 10.8 fL (ref 8.6–12.4)
PLATELETS: 205 10*3/uL (ref 150–400)
RBC: 4.87 MIL/uL (ref 3.87–5.11)
RDW: 13.7 % (ref 11.5–15.5)
WBC: 7.5 10*3/uL (ref 4.0–10.5)

## 2014-10-29 LAB — COMPREHENSIVE METABOLIC PANEL
ALK PHOS: 61 U/L (ref 39–117)
ALT: 29 U/L (ref 0–35)
AST: 43 U/L — ABNORMAL HIGH (ref 0–37)
Albumin: 4 g/dL (ref 3.5–5.2)
BILIRUBIN TOTAL: 0.6 mg/dL (ref 0.2–1.2)
BUN: 13 mg/dL (ref 6–23)
CO2: 29 mEq/L (ref 19–32)
CREATININE: 0.67 mg/dL (ref 0.50–1.10)
Calcium: 8.9 mg/dL (ref 8.4–10.5)
Chloride: 104 mEq/L (ref 96–112)
GLUCOSE: 85 mg/dL (ref 70–99)
Potassium: 4.4 mEq/L (ref 3.5–5.3)
SODIUM: 140 meq/L (ref 135–145)
Total Protein: 6.4 g/dL (ref 6.0–8.3)

## 2014-10-29 LAB — HEMOGLOBIN A1C
HEMOGLOBIN A1C: 4.9 % (ref <5.7)
MEAN PLASMA GLUCOSE: 94 mg/dL (ref <117)

## 2014-10-29 MED ORDER — PHENTERMINE-TOPIRAMATE ER 7.5-46 MG PO CP24: 1.0000 | ORAL_CAPSULE | ORAL | Status: DC

## 2014-10-29 MED ORDER — PHENTERMINE-TOPIRAMATE ER 3.75-23 MG PO CP24: 1.0000 | ORAL_CAPSULE | ORAL | Status: DC

## 2014-11-09 ENCOUNTER — Other Ambulatory Visit: Payer: Self-pay | Admitting: Medical

## 2014-11-09 ENCOUNTER — Telehealth: Payer: Self-pay | Admitting: Internal Medicine

## 2014-11-09 MED ORDER — PHENTERMINE HCL 37.5 MG PO TABS: 37.5000 mg | ORAL_TABLET | Freq: Every day | ORAL | Status: DC

## 2014-11-09 NOTE — Telephone Encounter (Signed)
Pt states that her qsymia gives her anger issues and would like to be switched to another med. [facility] on [address].    Pt also needs a referral through insurance for a dermatologist

## 2014-11-09 NOTE — Telephone Encounter (Signed)
Call in plain phentermine.  I spoke with pt in person when she was with her boyfriend at his appt this morning

## 2014-11-10 NOTE — Telephone Encounter (Signed)
I called out Phentermine to the [facility]

## 2015-01-13 ENCOUNTER — Telehealth: Payer: Self-pay

## 2015-01-13 NOTE — Telephone Encounter (Signed)
LM for patient to confirm dermatology appt 02/08/15 @ 10:20

## 2015-01-13 NOTE — Telephone Encounter (Signed)
Patient called inquiring about dermatology appointment. I scheduled appt with Dr Terri PiedraLupton 02/07/25 @ 10:20 be there 10:10. [facility] fax[facility] to [phone]

## 2015-01-14 NOTE — Telephone Encounter (Signed)
LMOM FOR THE PATIENT ABOUT HER APPOINTMENT DETAILS.

## 2015-07-11 ENCOUNTER — Telehealth: Payer: Self-pay

## 2015-07-11 NOTE — Telephone Encounter (Signed)
I would need to see her for appt before starting medication, and she will need to sign HIPPA on our end to allow me to talk to her counselor.   I assume she signed HIPPA on the counselor's end.

## 2015-07-11 NOTE — Telephone Encounter (Signed)
She wants you speak to her counselor about getting an anti depressant instead of her having to make an appt. She doesn't want to make an appt if she doesn't have to since money is an issue for her. Her counselor's name is Efraim KaufmannMelissa and her contact number is [phone].

## 2015-07-12 NOTE — Telephone Encounter (Signed)
Will call back to schedule appt when she has the money

## 2015-07-12 NOTE — Telephone Encounter (Signed)
LMTCB

## 2015-09-30 ENCOUNTER — Telehealth: Payer: Self-pay

## 2015-09-30 NOTE — Telephone Encounter (Signed)
[facility]  [phone]

## 2015-10-11 DIAGNOSIS — Z0279 Encounter for issue of other medical certificate: Secondary | ICD-10-CM

## 2016-07-25 DIAGNOSIS — Z23 Encounter for immunization: Secondary | ICD-10-CM | POA: Diagnosis not present

## 2016-11-08 DIAGNOSIS — Z6836 Body mass index (BMI) 36.0-36.9, adult: Secondary | ICD-10-CM | POA: Diagnosis not present

## 2016-11-08 DIAGNOSIS — Z13220 Encounter for screening for lipoid disorders: Secondary | ICD-10-CM | POA: Diagnosis not present

## 2016-11-08 DIAGNOSIS — Z130 Encounter for screening for diseases of the blood and blood-forming organs and certain disorders involving the immune mechanism: Secondary | ICD-10-CM | POA: Diagnosis not present

## 2016-11-08 DIAGNOSIS — Z01419 Encounter for gynecological examination (general) (routine) without abnormal findings: Secondary | ICD-10-CM | POA: Diagnosis not present

## 2016-11-08 DIAGNOSIS — Z124 Encounter for screening for malignant neoplasm of cervix: Secondary | ICD-10-CM | POA: Diagnosis not present

## 2016-11-08 DIAGNOSIS — Z1231 Encounter for screening mammogram for malignant neoplasm of breast: Secondary | ICD-10-CM | POA: Diagnosis not present

## 2016-11-08 DIAGNOSIS — Z1329 Encounter for screening for other suspected endocrine disorder: Secondary | ICD-10-CM | POA: Diagnosis not present

## 2016-11-09 LAB — HM MAMMOGRAPHY

## 2016-11-12 LAB — HM PAP SMEAR

## 2017-01-10 DIAGNOSIS — F39 Unspecified mood [affective] disorder: Secondary | ICD-10-CM | POA: Diagnosis not present

## 2017-01-10 DIAGNOSIS — Z6835 Body mass index (BMI) 35.0-35.9, adult: Secondary | ICD-10-CM | POA: Diagnosis not present

## 2017-02-02 DIAGNOSIS — B850 Pediculosis due to Pediculus humanus capitis: Secondary | ICD-10-CM | POA: Diagnosis not present

## 2017-08-23 ENCOUNTER — Encounter: Payer: Self-pay | Admitting: Medical

## 2017-08-23 ENCOUNTER — Ambulatory Visit (INDEPENDENT_AMBULATORY_CARE_PROVIDER_SITE_OTHER): Payer: BLUE CROSS/BLUE SHIELD | Admitting: Medical

## 2017-08-23 VITALS — HR 85 | Ht 65.0 in | Wt 232.6 lb

## 2017-08-23 DIAGNOSIS — Z0000 Encounter for general adult medical examination without abnormal findings: Secondary | ICD-10-CM

## 2017-08-23 DIAGNOSIS — E669 Obesity, unspecified: Secondary | ICD-10-CM | POA: Insufficient documentation

## 2017-08-23 DIAGNOSIS — R41840 Attention and concentration deficit: Secondary | ICD-10-CM | POA: Diagnosis not present

## 2017-08-23 DIAGNOSIS — R05 Cough: Secondary | ICD-10-CM | POA: Diagnosis not present

## 2017-08-23 DIAGNOSIS — F329 Major depressive disorder, single episode, unspecified: Secondary | ICD-10-CM | POA: Diagnosis not present

## 2017-08-23 DIAGNOSIS — R5383 Other fatigue: Secondary | ICD-10-CM | POA: Diagnosis not present

## 2017-08-23 DIAGNOSIS — Z23 Encounter for immunization: Secondary | ICD-10-CM | POA: Diagnosis not present

## 2017-08-23 DIAGNOSIS — R059 Cough, unspecified: Secondary | ICD-10-CM

## 2017-08-23 DIAGNOSIS — R4589 Other symptoms and signs involving emotional state: Secondary | ICD-10-CM

## 2017-08-23 LAB — POCT URINALYSIS DIP (PROADVANTAGE DEVICE)
Bilirubin, UA: NEGATIVE
GLUCOSE UA: NEGATIVE mg/dL
Ketones, POC UA: NEGATIVE mg/dL
Leukocytes, UA: NEGATIVE
NITRITE UA: NEGATIVE
PROTEIN UA: NEGATIVE mg/dL
RBC UA: NEGATIVE
Specific Gravity, Urine: 1.02
UUROB: NEGATIVE
pH, UA: 6 (ref 5.0–8.0)

## 2017-08-23 NOTE — Progress Notes (Signed)
Subjective:   HPI  Lindsey Avila is a 41 y.o. female who presents for physical.  Last visit here 2016. Chief Complaint  Patient presents with  . Annual Exam    physical, no pap    Medical care team includes: Virgen Belland, Kermit Balo, PA-C here for primary care Dentist Eye doctor Dr. Waynard Reeds, [facility]   Concerns: She notes slight cough for up to 2 months, jokingly calls it tuberculosis.   Sometimes no cough, some days often.  Cough is nonproductive.  Has had some congestion.   Has some sinus pressure.   No sore throat, no ear pain, no fever, no sneezing.  No SOB,no wheezing.   No chest pain.  Is having weight gain, has struggled with weight for years.  Can't focus on things of late, problematic for her work.   Hard for her to get things accomplished.  Can't seem to complete tasks, gets easily distracted.   This has been going on over a year.   Has seen accupuncutrist.     Having lethargy, no motivation to exercise, hard to get up and do anything.  If no work for the day, will sleep a lot or sit on the cough.   Never awakes rested, snores.  No witnessed apnea, no family hx/o sleep apnea.    Psychologist - depression.  Has always felt some depression for years, but a little worse this past year.  No specific trigger.  She discuss this with her gynecologist recently.  Was given 2 different medications to try.  She tried one and didn't tolerate it, and never picked the other one up.  Reviewed their medical, surgical, family, social, medication, and allergy history and updated chart as appropriate.  Past Medical History:  Diagnosis Date  . Anemia    in remote past due to heavy periods  . Chronic back pain   . Depression 2009   prior with pregnancy  . Encounter for routine gynecological examination    Dr. Waynard Reeds  . IUD (intrauterine device) in place    Mirena, placed 02/2014 (second one)  . Obesity   . Recurrent urinary tract infection    no prior urology consult  .  Seasonal allergies   . Skin anomaly    keratinosis  . Wears glasses     Past Surgical History:  Procedure Laterality Date  . KNEE ARTHROSCOPY     left    Social History   Socioeconomic History  . Marital status: Married    Spouse name: Not on file  . Number of children: Not on file  . Years of education: Not on file  . Highest education level: Not on file  Social Needs  . Financial resource strain: Not on file  . Food insecurity - worry: Not on file  . Food insecurity - inability: Not on file  . Transportation needs - medical: Not on file  . Transportation needs - non-medical: Not on file  Occupational History  . Not on file  Tobacco Use  . Smoking status: Never Smoker  . Smokeless tobacco: Never Used  Substance and Sexual Activity  . Alcohol use: Yes    Alcohol/week: 1.8 oz    Types: 2 Glasses of wine, 1 Shots of liquor per week  . Drug use: No  . Sexual activity: Not on file  Other Topics Concern  . Not on file  Social History Narrative   Lives with 9yo son, cat and dog.  Boyfriend.  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  Exercise - nothing currently but prior  with treadmill, cardio, circuit training.  06/2017    Family History  Problem Relation Age of Onset  . Hypertension Mother   . Hypothyroidism Mother   . Obesity Mother   . Macular degeneration Mother   . Cataracts Mother   . Hypertension Father   . Cancer Father        basal cell skin  . Benign prostatic hyperplasia Father   . COPD Maternal Grandmother   . Asthma Maternal Grandmother   . Dementia Maternal Grandfather   . Cancer Paternal Grandmother        pancreatic  . Heart disease Paternal Grandfather        MI  . Hypertension Paternal Grandfather   . Stroke Neg Hx   . Diabetes Neg Hx      Current Outpatient Medications:  .  phentermine (ADIPEX-P) 37.5 MG tablet, Take 1 tablet (37.5 mg total) by mouth daily before breakfast. (Patient not taking: Reported on 08/23/2017), Disp: 30  tablet, Rfl: 1  Allergies  Allergen Reactions  . Penicillins   . Sulfa Antibiotics     Review of Systems Constitutional: -fever, -chills, -sweats, +unexpected weight change, -decreased appetite, +fatigue Allergy: -sneezing, -itching, -congestion Dermatology: -changing moles, --rash, -lumps ENT: -runny nose, -ear pain, -sore throat, -hoarseness, +sinus pain, -teeth pain, - ringing in ears, -hearing loss, -nosebleeds Cardiology: -chest pain, -palpitations, -swelling, -difficulty breathing when lying flat, -waking up short of breath Respiratory: +cough, -shortness of breath, -difficulty breathing with exercise or exertion, -wheezing, -coughing up blood Gastroenterology: -abdominal pain, -nausea, -vomiting, -diarrhea, -constipation, -blood in stool, -changes in bowel movement, -difficulty swallowing or eating Hematology: -bleeding, -bruising  Musculoskeletal: -joint aches, -muscle aches, -joint swelling, -back pain, -neck pain, -cramping, -changes in gait Ophthalmology: denies vision changes, eye redness, itching, discharge Urology: -burning with urination, -difficulty urinating, -blood in urine, -urinary frequency, -urgency, -incontinence Neurology: -headache, -weakness, -tingling, -numbness, -memory loss, -falls, -dizziness Psychology: +depressed mood, -agitation, -sleep problems Breast/gyn: -breast tenderness, -discharge, -lumps, -vaginal discharge,- irregular periods, -heavy periods    Objective:   Pulse 85   Ht 5\' 5"  (1.651 m)   Wt 232 lb 9.6 oz (105.5 kg)   SpO2 97%   BMI 38.71 kg/m   General appearance: alert, no distress, WD/WN, Caucasian female Skin: no worrisome lesions HEENT: normocephalic, conjunctiva/corneas normal, sclerae anicteric, PERRLA, EOMi, nares patent, no discharge or erythema, pharynx normal Oral cavity: MMM, tongue normal, teeth normal Neck: supple, no lymphadenopathy, no thyromegaly, no masses, normal ROM, no bruits Chest: non tender, normal shape and  expansion Heart: RRR, normal S1, S2, no murmurs Lungs: CTA bilaterally, no wheezes, rhonchi, or rales Abdomen: +bs, soft, non tender, non distended, no masses, no hepatomegaly, no splenomegaly, no bruits Back: non tender, normal ROM, no scoliosis Musculoskeletal: upper extremities non tender, no obvious deformity, normal ROM throughout, lower extremities non tender, no obvious deformity, normal ROM throughout Extremities: no edema, no cyanosis, no clubbing Pulses: 2+ symmetric, upper and lower extremities, normal cap refill Neurological: alert, oriented x 3, CN2-12 intact, strength normal upper extremities and lower extremities, sensation normal throughout, DTRs 2+ throughout, no cerebellar signs, gait normal Psychiatric: normal affect, behavior normal, pleasant  Breast/gyn/rectal - deferred to gynecology     Assessment and Plan :    Encounter Diagnoses  Name Primary?  . Routine general medical examination at a health care facility Yes  . Obesity with serious comorbidity, unspecified classification, unspecified obesity type   .  Depressed mood   . Attention deficit   . Lethargy   . Cough   . Need for influenza vaccination   . Need for Tdap vaccination     Physical exam - discussed and counseled on healthy lifestyle, diet, exercise, preventative care, vaccinations, sick and well care, proper use of emergency dept and after hours care, and addressed their concerns.    Health screening: See your eye doctor yearly for routine vision care. See your dentist yearly for routine dental care including hygiene visits twice yearly.  Cancer screening Discussed and advised monthly self breast exams Discussed mammogram, advised mammogram q1-2 years Discussed pap smear recommendations.   Pap smear per gyn Discussed colonoscopy screening age 43yo unless higher risk for earlier screening  Vaccinations: Counseled on the influenza virus vaccine.  Vaccine information sheet given.  Influenza  vaccine given after consent obtained.  Counseled on the Tdap (tetanus, diptheria, and acellular pertussis) vaccine.  Vaccine information sheet given. Tdap vaccine given after consent obtained.   Separate significant chronic issues discussed: Cough -begin Mucinex DM or Benadryl nightly for the next 5 days, hydrate well, if not improving let me know.  If cough not resolved within the next 10-14 days then go for chest xray  Obesity - counseled on diet, exercise, setting goals.  She requested phentermine, consider medication options pending labs.    Depressed mood, lethargy - discussed differential, deconditioning, depression, vitamin deficiency, sleep apnea.   Consider sleep study, labs today, consider going back to counseling, consider medication.   Advised she begin daily multivitamin and B12 supplement.  Lindsey Avila was seen today for annual exam.  Diagnoses and all orders for this visit:  Routine general medical examination at a health care facility -     POCT Urinalysis DIP (Proadvantage Device) -     Comprehensive metabolic panel -     CBC -     Lipid panel -     Hemoglobin A1c -     TSH -     VITAMIN D 25 Hydroxy (Vit-D Deficiency, Fractures)  Obesity with serious comorbidity, unspecified classification, unspecified obesity type  Depressed mood -     TSH -     VITAMIN D 25 Hydroxy (Vit-D Deficiency, Fractures)  Attention deficit  Lethargy  Cough -     DG Chest 2 View; Future  Need for influenza vaccination  Need for Tdap vaccination   Follow-up pending labs, yearly for physical

## 2017-08-24 LAB — LIPID PANEL
CHOL/HDL RATIO: 2.8 (calc) (ref <5.0)
Cholesterol: 163 mg/dL (ref <200)
HDL: 59 mg/dL (ref >50)
LDL CHOLESTEROL (CALC): 91 mg/dL
NON-HDL CHOLESTEROL (CALC): 104 mg/dL (ref <130)
TRIGLYCERIDES: 50 mg/dL (ref <150)

## 2017-08-24 LAB — CBC
HEMATOCRIT: 42.6 % (ref 35.0–45.0)
Hemoglobin: 14.4 g/dL (ref 11.7–15.5)
MCH: 28.5 pg (ref 27.0–33.0)
MCHC: 33.8 g/dL (ref 32.0–36.0)
MCV: 84.4 fL (ref 80.0–100.0)
MPV: 11.5 fL (ref 7.5–12.5)
PLATELETS: 209 10*3/uL (ref 140–400)
RBC: 5.05 10*6/uL (ref 3.80–5.10)
RDW: 12.7 % (ref 11.0–15.0)
WBC: 6.8 10*3/uL (ref 3.8–10.8)

## 2017-08-24 LAB — COMPREHENSIVE METABOLIC PANEL
AG RATIO: 1.6 (calc) (ref 1.0–2.5)
ALT: 22 U/L (ref 6–29)
AST: 17 U/L (ref 10–30)
Albumin: 3.9 g/dL (ref 3.6–5.1)
Alkaline phosphatase (APISO): 61 U/L (ref 33–115)
BUN: 14 mg/dL (ref 7–25)
CO2: 27 mmol/L (ref 20–32)
CREATININE: 0.71 mg/dL (ref 0.50–1.10)
Calcium: 9.1 mg/dL (ref 8.6–10.2)
Chloride: 106 mmol/L (ref 98–110)
GLOBULIN: 2.5 g/dL (ref 1.9–3.7)
GLUCOSE: 91 mg/dL (ref 65–99)
Potassium: 4.6 mmol/L (ref 3.5–5.3)
SODIUM: 139 mmol/L (ref 135–146)
TOTAL PROTEIN: 6.4 g/dL (ref 6.1–8.1)
Total Bilirubin: 0.5 mg/dL (ref 0.2–1.2)

## 2017-08-24 LAB — HEMOGLOBIN A1C
HEMOGLOBIN A1C: 4.8 %{Hb} (ref <5.7)
Mean Plasma Glucose: 91 (calc)
eAG (mmol/L): 5 (calc)

## 2017-08-24 LAB — VITAMIN D 25 HYDROXY (VIT D DEFICIENCY, FRACTURES): VIT D 25 HYDROXY: 26 ng/mL — AB (ref 30–100)

## 2017-08-24 LAB — TSH: TSH: 2.6 m[IU]/L

## 2017-08-27 ENCOUNTER — Other Ambulatory Visit: Payer: Self-pay | Admitting: Medical

## 2017-08-27 MED ORDER — PHENTERMINE HCL 37.5 MG PO TABS: 37.5000 mg | ORAL_TABLET | Freq: Every day | ORAL | 0 refills | Status: DC

## 2017-08-28 ENCOUNTER — Other Ambulatory Visit: Payer: Self-pay | Admitting: Medical

## 2017-08-28 MED ORDER — VITAMIN D (ERGOCALCIFEROL) 1.25 MG (50000 UNIT) PO CAPS
50000.0000 [IU] | ORAL_CAPSULE | ORAL | 3 refills | Status: DC
Start: 2017-08-28 — End: 2018-09-18

## 2017-08-28 MED ORDER — NALTREXONE-BUPROPION HCL ER 8-90 MG PO TB12: 2.0000 | ORAL_TABLET | Freq: Two times a day (BID) | ORAL | 1 refills | Status: DC

## 2017-09-19 ENCOUNTER — Telehealth: Payer: Self-pay | Admitting: Medical

## 2017-09-19 NOTE — Telephone Encounter (Signed)
[facility]. Sending back for review.

## 2017-09-27 ENCOUNTER — Telehealth: Payer: Self-pay | Admitting: Medical

## 2017-09-27 NOTE — Telephone Encounter (Signed)
Pt called and stated that she has developed a rash. Its not all over just in random places.  She thinks it might be from Contrave. She took Benadryl last night but the rash has soon back up today under her breast. She has been on the higher dose for over a week and the rash just started yesterday. 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 Please advise pt at [phone].

## 2017-09-27 NOTE — Telephone Encounter (Signed)
STOP contrave  and come in next week for eval of the rash.

## 2017-09-30 ENCOUNTER — Encounter: Payer: Self-pay | Admitting: Medical

## 2017-09-30 ENCOUNTER — Ambulatory Visit (INDEPENDENT_AMBULATORY_CARE_PROVIDER_SITE_OTHER): Payer: BLUE CROSS/BLUE SHIELD | Admitting: Medical

## 2017-09-30 VITALS — BP 118/76 | HR 95 | Wt 234.2 lb

## 2017-09-30 DIAGNOSIS — E669 Obesity, unspecified: Secondary | ICD-10-CM | POA: Diagnosis not present

## 2017-09-30 DIAGNOSIS — L509 Urticaria, unspecified: Secondary | ICD-10-CM | POA: Diagnosis not present

## 2017-09-30 DIAGNOSIS — T7840XA Allergy, unspecified, initial encounter: Secondary | ICD-10-CM

## 2017-09-30 MED ORDER — EPINEPHRINE 0.3 MG/0.3ML IJ SOAJ: 0.3000 mg | Freq: Once | INTRAMUSCULAR | 0 refills | Status: AC

## 2017-09-30 MED ORDER — PHENTERMINE HCL 37.5 MG PO TABS
37.5000 mg | ORAL_TABLET | Freq: Every day | ORAL | 0 refills | Status: DC
Start: 2017-09-30 — End: 2020-06-06

## 2017-09-30 NOTE — Telephone Encounter (Signed)
Pt coming in today

## 2017-09-30 NOTE — Progress Notes (Signed)
  Subjective:  Lindsey Avila is a 42 y.o. female who presents for allergic reaction. Chief Complaint  Patient presents with  . Rash    rash/ hives  on her neck and arms , under  breast  started last thursday    Here for complaint of hives.  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  First noticed on back of neck and right arm, under breast, but today mainly back of neck and right forearm.   Rash is puffy, pink, itchy.     Started taking Contrave right after last visit here in December.   Has been on this about 5 weeks.   The second week on full dosing of Contrave (this past week) she started noticing hives.   No other triggers she was concerned about causing the hives.   No other recent new exposures, no new foods.   No SOB or wheezing.  Felt panicky when she saw hives, given hx/o anaphylaxis with antibiotics.    Stopped Contrave Friday morning, been using benadryl once daily.   Didn't take the benadryl today 25mg .   Took 50mg  Benadryl the first night she noticed hives     No other aggravating or relieving factors.    No other c/o.  The following portions of the patient's history were reviewed and updated as appropriate: allergies, current medications, past family history, past medical history, past social history, past surgical history and problem list.  ROS Otherwise as in subjective above   Objective: BP 118/76   Pulse 95   Wt 234 lb 3.2 oz (106.2 kg)   SpO2 99%   BMI 38.97 kg/m   General appearance: alert, no distress, WD/WN Skin: patches of pink urticarial lesions of posterior neck and right forearm Oral cavity: MMM, no lesions Neck: supple, no lymphadenopathy, no thyromegaly, no masses Heart: RRR, normal S1, S2, no murmurs Lungs: CTA bilaterally, no wheezes, rhonchi, or rales Pulses: 2+ radial pulses, 2+ pedal pulses,  normal cap refill Ext: no edema    Assessment: Encounter Diagnoses  Name Primary?  . Allergic reaction, initial encounter Yes  . Hives   . Obesity with serious comorbidity, unspecified classification, unspecified obesity type      Plan: Hives, allergic reaction-begin Benadryl at bedtime 25 mg the next few nights, 1/2 tablet of Benadryl during the day,.  She has already stopped Contrave.  Hydrate well.  Can use Cortaid topically if needed.  Prescribed EpiPen in the event of anaphylactic reaction.  We discussed this in proper use of EpiPen  In regards to obesity and once she has been hives free for at least a week or 2, she can use phentermine 1/2 tablet 3 days a week until she runs out. call if any worsening in the next few days  Continue to work on healthy diet and exercise  Follow up: if not improving in next 1-2 days, then otherwise 89mo

## 2018-01-17 ENCOUNTER — Encounter: Payer: Self-pay | Admitting: Medical

## 2018-02-07 DIAGNOSIS — Z124 Encounter for screening for malignant neoplasm of cervix: Secondary | ICD-10-CM | POA: Diagnosis not present

## 2018-02-07 DIAGNOSIS — Z6836 Body mass index (BMI) 36.0-36.9, adult: Secondary | ICD-10-CM | POA: Diagnosis not present

## 2018-02-07 DIAGNOSIS — Z01419 Encounter for gynecological examination (general) (routine) without abnormal findings: Secondary | ICD-10-CM | POA: Diagnosis not present

## 2018-02-07 LAB — HM PAP SMEAR

## 2018-02-07 LAB — RESULTS CONSOLE HPV: CHL HPV: NEGATIVE

## 2018-02-20 DIAGNOSIS — Z6836 Body mass index (BMI) 36.0-36.9, adult: Secondary | ICD-10-CM | POA: Diagnosis not present

## 2018-02-20 DIAGNOSIS — Z1231 Encounter for screening mammogram for malignant neoplasm of breast: Secondary | ICD-10-CM | POA: Diagnosis not present

## 2018-02-21 LAB — HM MAMMOGRAPHY

## 2018-08-13 DIAGNOSIS — Z30433 Encounter for removal and reinsertion of intrauterine contraceptive device: Secondary | ICD-10-CM | POA: Diagnosis not present

## 2018-08-13 DIAGNOSIS — Z6837 Body mass index (BMI) 37.0-37.9, adult: Secondary | ICD-10-CM | POA: Diagnosis not present

## 2018-09-18 ENCOUNTER — Other Ambulatory Visit: Payer: Self-pay | Admitting: Medical

## 2018-09-18 NOTE — Telephone Encounter (Signed)
Get in for physical and refill

## 2018-09-18 NOTE — Telephone Encounter (Signed)
Is this ok to refill?  

## 2018-10-02 ENCOUNTER — Other Ambulatory Visit: Payer: Self-pay | Admitting: Medical

## 2018-10-03 DIAGNOSIS — Z6836 Body mass index (BMI) 36.0-36.9, adult: Secondary | ICD-10-CM | POA: Diagnosis not present

## 2018-10-03 DIAGNOSIS — Z30431 Encounter for routine checking of intrauterine contraceptive device: Secondary | ICD-10-CM | POA: Diagnosis not present

## 2018-10-03 MED ORDER — VITAMIN D (ERGOCALCIFEROL) 1.25 MG (50000 UNIT) PO CAPS: 50000.0000 [IU] | ORAL_CAPSULE | ORAL | 0 refills | Status: DC

## 2018-10-03 NOTE — Addendum Note (Signed)
Addended by: Herminio Commons A on: 10/03/2018 04:14 PM   Modules accepted: Orders

## 2018-10-03 NOTE — Telephone Encounter (Signed)
Lets get in for physical, fasting labs .   Refill once scheduled

## 2018-10-03 NOTE — Telephone Encounter (Signed)
Is this request appropriate? 

## 2018-10-07 ENCOUNTER — Encounter: Payer: Self-pay | Admitting: Medical

## 2018-10-07 ENCOUNTER — Ambulatory Visit (INDEPENDENT_AMBULATORY_CARE_PROVIDER_SITE_OTHER): Payer: BLUE CROSS/BLUE SHIELD | Admitting: Medical

## 2018-10-07 VITALS — BP 120/70 | HR 69 | Temp 97.7°F | Resp 16 | Ht 66.0 in | Wt 224.2 lb

## 2018-10-07 DIAGNOSIS — G8929 Other chronic pain: Secondary | ICD-10-CM | POA: Insufficient documentation

## 2018-10-07 DIAGNOSIS — Z0000 Encounter for general adult medical examination without abnormal findings: Secondary | ICD-10-CM | POA: Diagnosis not present

## 2018-10-07 DIAGNOSIS — E669 Obesity, unspecified: Secondary | ICD-10-CM

## 2018-10-07 DIAGNOSIS — E559 Vitamin D deficiency, unspecified: Secondary | ICD-10-CM | POA: Diagnosis not present

## 2018-10-07 DIAGNOSIS — M25561 Pain in right knee: Secondary | ICD-10-CM | POA: Diagnosis not present

## 2018-10-07 DIAGNOSIS — F329 Major depressive disorder, single episode, unspecified: Secondary | ICD-10-CM

## 2018-10-07 DIAGNOSIS — R4589 Other symptoms and signs involving emotional state: Secondary | ICD-10-CM

## 2018-10-07 DIAGNOSIS — S838X1A Sprain of other specified parts of right knee, initial encounter: Secondary | ICD-10-CM

## 2018-10-07 LAB — POCT URINALYSIS DIP (PROADVANTAGE DEVICE)
BILIRUBIN UA: NEGATIVE
Glucose, UA: NEGATIVE mg/dL
Ketones, POC UA: NEGATIVE mg/dL
Leukocytes, UA: NEGATIVE
Nitrite, UA: NEGATIVE
Protein Ur, POC: NEGATIVE mg/dL
RBC UA: NEGATIVE
Specific Gravity, Urine: 1.03
Urobilinogen, Ur: NEGATIVE
pH, UA: 5.5 (ref 5.0–8.0)

## 2018-10-07 MED ORDER — BUPROPION HCL 100 MG PO TABS: 100.0000 mg | ORAL_TABLET | Freq: Two times a day (BID) | ORAL | 1 refills | Status: DC

## 2018-10-07 NOTE — Progress Notes (Signed)
Subjective:   HPI  Lindsey Avila is a 43 y.o. female who presents for Chief Complaint  Patient presents with  . CPE    fasting CPE    eye exam 10/19  has GYN    Medical care team includes: , Kermit Balo, PA-C here for primary care Dentist Eye doctor Gynecology, Dr. Waynard Reeds  Concerns: Here for physical  Recently was doing kickboxing, had improvements in mood and energy, but then injured right knee.   For last 3 weeks not able to walk due to knee pain.  Has hx/o left knee surgery for arthroscopy and cleaning up the meniscus.    Was advised to have right knee surgery then too but never went through with right knee.  Left knee still gives problems.  Still trying to lose weight.  Still trying to control depression.  More down of late.  No SI, HI. 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  Would like to try medication to help.  Reviewed their medical, surgical, family, social, medication, and allergy history and updated chart as appropriate.  Past Medical History:  Diagnosis Date  . Anemia    in remote past due to heavy periods  . Chronic back pain   . Depression 2009   prior with pregnancy  . Encounter for routine gynecological examination    Dr. Waynard Reeds  . IUD (intrauterine device) in place    IUD replaced 08/13/2018 with Mirena; Dr. Waynard Reeds  . Obesity   . Recurrent urinary tract infection    no prior urology consult  . Seasonal allergies   . Skin anomaly    keratinosis  . Wears glasses     Past Surgical History:  Procedure Laterality Date  . KNEE ARTHROSCOPY     left, arthroscopic    Social History   Socioeconomic History  . Marital status: Married    Spouse name: Not on file  . Number of children: Not on file  . Years of education: Not on file  . Highest education level: Not on file  Occupational History  . Not on file  Social Needs  . Financial resource strain: Not on file  . Food insecurity:    Worry: Not on file    Inability: Not on file  .  Transportation needs:    Medical: Not on file    Non-medical: Not on file  Tobacco Use  . Smoking status: Never Smoker  . Smokeless tobacco: Never Used  Substance and Sexual Activity  . Alcohol use: Yes    Alcohol/week: 3.0 standard drinks    Types: 2 Glasses of wine, 1 Shots of liquor per week  . Drug use: No  . Sexual activity: Not on file  Lifestyle  . Physical activity:    Days per week: Not on file    Minutes per session: Not on file  . Stress: Not on file  Relationships  . Social connections:    Talks on phone: Not on file    Gets together: Not on file    Attends religious service: Not on file    Active member of club or organization: Not on file    Attends meetings of clubs or organizations: Not on file    Relationship status: Not on file  . Intimate partner violence:    Fear of current or ex partner: Not on file    Emotionally abused: Not on file    Physically abused: Not on file    Forced sexual activity: Not on file  Other Topics Concern  . Not on file  Social History Narrative   Lives with 10yo son, cat and dog.  Boyfriend.  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  Exercise - kickboxing, but prior  with treadmill, cardio, circuit training.  09/2018    Family History  Problem Relation Age of Onset  . Hypertension Mother   . Hypothyroidism Mother   . Obesity Mother   . Macular degeneration Mother   . Cataracts Mother   . Hypertension Father   . Cancer Father        basal cell skin  . Benign prostatic hyperplasia Father   . COPD Maternal Grandmother   . Asthma Maternal Grandmother   . Dementia Maternal Grandfather   . Cancer Paternal Grandmother        pancreatic  . Heart disease Paternal Grandfather        MI  . Hypertension Paternal Grandfather   . Stroke Neg Hx   . Diabetes Neg Hx      Current Outpatient Medications:  Marland Kitchen.  Vitamin D, Ergocalciferol, (DRISDOL) 1.25 MG (50000 UT) CAPS capsule, Take 1 capsule (50,000 Units total) by mouth  every 7 (seven) days., Disp: 4 capsule, Rfl: 0 .  buPROPion (WELLBUTRIN) 100 MG tablet, Take 1 tablet (100 mg total) by mouth 2 (two) times daily., Disp: 60 tablet, Rfl: 1 .  phentermine (ADIPEX-P) 37.5 MG tablet, Take 1 tablet (37.5 mg total) by mouth daily before breakfast. (Patient not taking: Reported on 10/07/2018), Disp: 30 tablet, Rfl: 0  Allergies  Allergen Reactions  . Contrave [Naltrexone-Bupropion Hcl Er]   . Penicillins   . Sulfa Antibiotics       Review of Systems Constitutional: -fever, -chills, -sweats, -unexpected weight change, -decreased appetite, -fatigue Allergy: -sneezing, -itching, -congestion Dermatology: -changing moles, --rash, -lumps ENT: -runny nose, -ear pain, -sore throat, -hoarseness, -sinus pain, -teeth pain, - ringing in ears, -hearing loss, -nosebleeds Cardiology: -chest pain, -palpitations, -swelling, -difficulty breathing when lying flat, -waking up short of breath Respiratory: -cough, -shortness of breath, -difficulty breathing with exercise or exertion, -wheezing, -coughing up blood Gastroenterology: -abdominal pain, -nausea, -vomiting, -diarrhea, -constipation, -blood in stool, -changes in bowel movement, -difficulty swallowing or eating Hematology: -bleeding, -bruising  Musculoskeletal: +joint aches, -muscle aches, -joint swelling, -back pain, -neck pain, -cramping, -changes in gait Ophthalmology: denies vision changes, eye redness, itching, discharge Urology: -burning with urination, -difficulty urinating, -blood in urine, -urinary frequency, -urgency, -incontinence Neurology: -headache, -weakness, -tingling, -numbness, -memory loss, -falls, -dizziness Psychology: +depressed mood, -agitation, -sleep problems Breast/gyn: -breast tenderness, -discharge, -lumps, -vaginal discharge,- irregular periods, -heavy periods     Objective:  BP 120/70   Pulse 69   Temp 97.7 F (36.5 C) (Oral)   Resp 16   Ht 5\' 6"  (1.676 m)   Wt 224 lb 3.2 oz (101.7 kg)    SpO2 99%   BMI 36.19 kg/m   General appearance: alert, no distress, WD/WN, Caucasian female Skin: unremarkable HEENT: normocephalic, conjunctiva/corneas normal, sclerae anicteric, PERRLA, EOMi, nares patent, no discharge or erythema, pharynx normal Oral cavity: MMM, tongue normal, teeth in good repair Neck: supple, no lymphadenopathy, no thyromegaly, no masses, normal ROM, no bruits Chest: non tender, normal shape and expansion Heart: RRR, normal S1, S2, no murmurs Lungs: CTA bilaterally, no wheezes, rhonchi, or rales Abdomen: +bs, soft, non tender, non distended, no masses, no hepatomegaly, no splenomegaly, no bruits Back: non tender, normal ROM, no scoliosis Musculoskeletal: pain with meniscal tests right knee, otherwise nontender, no swelling, no  laxity, otherwise upper extremities non tender, no obvious deformity, normal ROM throughout, lower extremities non tender, no obvious deformity, normal ROM throughout Extremities: no edema, no cyanosis, no clubbing Pulses: 2+ symmetric, upper and lower extremities, normal cap refill Neurological: alert, oriented x 3, CN2-12 intact, strength normal upper extremities and lower extremities, sensation normal throughout, DTRs 2+ throughout, no cerebellar signs, gait normal Psychiatric: normal affect, behavior normal, pleasant  Breast/gyn/rectal - deferred to gynecology     Assessment and Plan :   Encounter Diagnoses  Name Primary?  . Routine general medical examination at a health care facility Yes  . Vitamin D deficiency   . Obesity with serious comorbidity, unspecified classification, unspecified obesity type   . Chronic pain of right knee   . Meniscal injury, right, initial encounter   . Depressed mood     Physical exam - discussed and counseled on healthy lifestyle, diet, exercise, preventative care, vaccinations, sick and well care, proper use of emergency dept and after hours care, and addressed their concerns.    Health  screening: Advised they see their eye doctor yearly for routine vision care. Advised they see their dentist yearly for routine dental care including hygiene visits twice yearly. See your gynecologist yearly for routine gynecological care.  Cancer screening Counseled on self breast exams, mammograms, cervical cancer screening  Vaccinations: Advised yearly influenza vaccine   Separate significant chronic issues discussed: Depressed mood - discussed symptoms, recommended counseling, begin trial of Wellbutrin, discussed risks/benefits of medication.   F/u 3-4 wk  Obesity - c/t efforts to lose weight.   Counseled on weight watchers vs calorie restriction  Right knee pain - begin reinforced knee brace x 2 weeks, alternate ice/heat therapy, Ibuprofen OTC 3 tablets BID x 2 weeks, relative rest, elevation of leg when resting.  F/u  2 weeks  Vit D deficiency - c/t supplement, counseled on diet, sun exposure, f/u pending labs  Dawan was seen today for cpe.  Diagnoses and all orders for this visit:  Routine general medical examination at a health care facility -     CBC -     Hemoglobin A1c -     VITAMIN D 25 Hydroxy (Vit-D Deficiency, Fractures) -     Basic metabolic panel -     TSH -     POCT Urinalysis DIP (Proadvantage Device)  Vitamin D deficiency  Obesity with serious comorbidity, unspecified classification, unspecified obesity type  Chronic pain of right knee  Meniscal injury, right, initial encounter  Depressed mood  Other orders -     buPROPion (WELLBUTRIN) 100 MG tablet; Take 1 tablet (100 mg total) by mouth 2 (two) times daily.   Follow-up pending labs, yearly for physical

## 2018-10-07 NOTE — Patient Instructions (Signed)
[facility] [address] [place], [place], [place] [address] Phone: [phone]

## 2018-10-08 LAB — BASIC METABOLIC PANEL
BUN/Creatinine Ratio: 24 — ABNORMAL HIGH (ref 9–23)
BUN: 17 mg/dL (ref 6–24)
CO2: 25 mmol/L (ref 20–29)
Calcium: 9.3 mg/dL (ref 8.7–10.2)
Chloride: 102 mmol/L (ref 96–106)
Creatinine, Ser: 0.72 mg/dL (ref 0.57–1.00)
GFR calc Af Amer: 118 mL/min/{1.73_m2} (ref >59)
GFR calc non Af Amer: 102 mL/min/{1.73_m2} (ref >59)
Glucose: 83 mg/dL (ref 65–99)
Potassium: 4.4 mmol/L (ref 3.5–5.2)
Sodium: 141 mmol/L (ref 134–144)

## 2018-10-08 LAB — HEMOGLOBIN A1C
Est. average glucose Bld gHb Est-mCnc: 91 mg/dL
Hgb A1c MFr Bld: 4.8 % (ref 4.8–5.6)

## 2018-10-08 LAB — CBC
Hematocrit: 41.4 % (ref 34.0–46.6)
Hemoglobin: 14.4 g/dL (ref 11.1–15.9)
MCH: 29.8 pg (ref 26.6–33.0)
MCHC: 34.8 g/dL (ref 31.5–35.7)
MCV: 86 fL (ref 79–97)
Platelets: 212 10*3/uL (ref 150–450)
RBC: 4.84 x10E6/uL (ref 3.77–5.28)
RDW: 12.5 % (ref 11.7–15.4)
WBC: 8.3 10*3/uL (ref 3.4–10.8)

## 2018-10-08 LAB — VITAMIN D 25 HYDROXY (VIT D DEFICIENCY, FRACTURES): Vit D, 25-Hydroxy: 32.8 ng/mL (ref 30.0–100.0)

## 2018-10-08 LAB — TSH: TSH: 2.9 u[IU]/mL (ref 0.450–4.500)

## 2018-10-13 ENCOUNTER — Telehealth: Payer: Self-pay | Admitting: Medical

## 2018-10-13 NOTE — Telephone Encounter (Signed)
Received requested records from [facility]. Sending back for review.  °

## 2018-10-14 ENCOUNTER — Encounter: Payer: Self-pay | Admitting: Medical

## 2018-10-15 ENCOUNTER — Encounter: Payer: Self-pay | Admitting: Medical

## 2018-10-24 ENCOUNTER — Telehealth: Payer: Self-pay | Admitting: Medical

## 2018-10-24 NOTE — Telephone Encounter (Signed)
Requested records received from [facility]. Sending back for review.

## 2018-10-29 ENCOUNTER — Other Ambulatory Visit: Payer: Self-pay | Admitting: Medical

## 2018-10-29 NOTE — Telephone Encounter (Signed)
Is this ok to refill?  

## 2018-10-30 NOTE — Telephone Encounter (Signed)
Get her in for f/u on wellbutrin.  Send 30 days supply if running out before appt

## 2018-10-31 NOTE — Telephone Encounter (Signed)
Patient states that she is not taking the wellbutrin as of yet so she does not need a refill.

## 2018-11-07 ENCOUNTER — Encounter: Payer: Self-pay | Admitting: Internal Medicine

## 2019-07-14 DIAGNOSIS — Z713 Dietary counseling and surveillance: Secondary | ICD-10-CM | POA: Diagnosis not present

## 2019-07-21 DIAGNOSIS — Z713 Dietary counseling and surveillance: Secondary | ICD-10-CM | POA: Diagnosis not present

## 2019-07-28 DIAGNOSIS — Z713 Dietary counseling and surveillance: Secondary | ICD-10-CM | POA: Diagnosis not present

## 2019-08-04 DIAGNOSIS — Z713 Dietary counseling and surveillance: Secondary | ICD-10-CM | POA: Diagnosis not present

## 2019-08-11 DIAGNOSIS — Z713 Dietary counseling and surveillance: Secondary | ICD-10-CM | POA: Diagnosis not present

## 2019-08-11 DIAGNOSIS — Z20828 Contact with and (suspected) exposure to other viral communicable diseases: Secondary | ICD-10-CM | POA: Diagnosis not present

## 2019-08-18 DIAGNOSIS — Z713 Dietary counseling and surveillance: Secondary | ICD-10-CM | POA: Diagnosis not present

## 2019-08-25 DIAGNOSIS — Z713 Dietary counseling and surveillance: Secondary | ICD-10-CM | POA: Diagnosis not present

## 2019-09-01 DIAGNOSIS — Z713 Dietary counseling and surveillance: Secondary | ICD-10-CM | POA: Diagnosis not present

## 2019-09-08 DIAGNOSIS — Z6839 Body mass index (BMI) 39.0-39.9, adult: Secondary | ICD-10-CM | POA: Diagnosis not present

## 2019-09-08 DIAGNOSIS — Z01419 Encounter for gynecological examination (general) (routine) without abnormal findings: Secondary | ICD-10-CM | POA: Diagnosis not present

## 2019-09-08 DIAGNOSIS — Z1231 Encounter for screening mammogram for malignant neoplasm of breast: Secondary | ICD-10-CM | POA: Diagnosis not present

## 2019-09-08 LAB — HM MAMMOGRAPHY

## 2020-05-18 DIAGNOSIS — F909 Attention-deficit hyperactivity disorder, unspecified type: Secondary | ICD-10-CM

## 2020-05-18 HISTORY — DX: Attention-deficit hyperactivity disorder, unspecified type: F90.9

## 2020-05-26 ENCOUNTER — Telehealth: Payer: Self-pay | Admitting: Medical

## 2020-05-26 NOTE — Telephone Encounter (Signed)
Pt is coming in at 9 to do a covid test

## 2020-05-26 NOTE — Telephone Encounter (Signed)
If she has no symptoms, she can test about 3-4 days after exposure, not sooner.  If symptoms such as cough, congestion, loss of smell or taste, can test now

## 2020-05-26 NOTE — Telephone Encounter (Signed)
Pt called and states that she was exposed on Tuesday  States she was not wearing a mask and she is vaccinated she wants to know if she should get tested, or when if it is to early and where, states she is not having any symptoms does have a sore throat but she does not know if that is from talking to much, she is concerned from passing it to her son and parents that are elderly,   Pt can be reached at [phone]

## 2020-05-26 NOTE — Telephone Encounter (Signed)
If she remains asymptomatic and just needs to be screened then yes she can just come in for test.  However if new or worsening symptoms then this needs to be coupled with a virtual consult, particularly if she is going to need a work note

## 2020-05-26 NOTE — Telephone Encounter (Signed)
Can she come here and just get a test or will she have to do a virtual and a test

## 2020-05-27 ENCOUNTER — Other Ambulatory Visit (INDEPENDENT_AMBULATORY_CARE_PROVIDER_SITE_OTHER): Payer: BC Managed Care – PPO

## 2020-05-27 DIAGNOSIS — Z20822 Contact with and (suspected) exposure to covid-19: Secondary | ICD-10-CM | POA: Diagnosis not present

## 2020-05-27 LAB — POC COVID19 BINAXNOW: SARS Coronavirus 2 Ag: NEGATIVE

## 2020-05-27 LAB — POCT INFLUENZA A/B
Influenza A, POC: NEGATIVE
Influenza B, POC: NEGATIVE

## 2020-05-30 ENCOUNTER — Telehealth: Payer: Self-pay | Admitting: Internal Medicine

## 2020-05-30 LAB — NOVEL CORONAVIRUS, NAA: SARS-CoV-2, NAA: NOT DETECTED

## 2020-05-30 NOTE — Telephone Encounter (Signed)
Pt was notified of results to covid.

## 2020-06-06 ENCOUNTER — Other Ambulatory Visit: Payer: Self-pay

## 2020-06-06 ENCOUNTER — Encounter: Payer: Self-pay | Admitting: Medical

## 2020-06-06 ENCOUNTER — Ambulatory Visit (INDEPENDENT_AMBULATORY_CARE_PROVIDER_SITE_OTHER): Payer: BC Managed Care – PPO | Admitting: Medical

## 2020-06-06 VITALS — BP 130/82 | HR 98 | Ht 66.0 in | Wt 243.0 lb

## 2020-06-06 DIAGNOSIS — Z1329 Encounter for screening for other suspected endocrine disorder: Secondary | ICD-10-CM | POA: Insufficient documentation

## 2020-06-06 DIAGNOSIS — Z0000 Encounter for general adult medical examination without abnormal findings: Secondary | ICD-10-CM | POA: Diagnosis not present

## 2020-06-06 DIAGNOSIS — Z1159 Encounter for screening for other viral diseases: Secondary | ICD-10-CM | POA: Diagnosis not present

## 2020-06-06 DIAGNOSIS — Z131 Encounter for screening for diabetes mellitus: Secondary | ICD-10-CM | POA: Diagnosis not present

## 2020-06-06 DIAGNOSIS — Z23 Encounter for immunization: Secondary | ICD-10-CM | POA: Diagnosis not present

## 2020-06-06 DIAGNOSIS — Z136 Encounter for screening for cardiovascular disorders: Secondary | ICD-10-CM | POA: Diagnosis not present

## 2020-06-06 DIAGNOSIS — E559 Vitamin D deficiency, unspecified: Secondary | ICD-10-CM | POA: Diagnosis not present

## 2020-06-06 DIAGNOSIS — Z7189 Other specified counseling: Secondary | ICD-10-CM

## 2020-06-06 DIAGNOSIS — Z7185 Encounter for immunization safety counseling: Secondary | ICD-10-CM | POA: Insufficient documentation

## 2020-06-06 DIAGNOSIS — F988 Other specified behavioral and emotional disorders with onset usually occurring in childhood and adolescence: Secondary | ICD-10-CM | POA: Insufficient documentation

## 2020-06-06 NOTE — Patient Instructions (Signed)
Preventative Care for Adults - Female   Thank you for coming in for your well visit today, and thank you for trusting [place] with your care!   Maintain regular health and wellness exams:  A routine yearly physical is a good way to check in with your primary care provider about your health and preventive screening. It is also an opportunity to share updates about your health and any concerns you have, and receive a thorough all-over exam.   Most health insurance companies pay for at least some preventative services.  Check with your health plan for specific coverages.  What preventative services do women need?  Adult women should have their weight and blood pressure checked regularly.   Women age 23 and older should have their cholesterol levels checked regularly.  Women should be screened for cervical cancer with a Pap smear and pelvic exam beginning at either age 46, or 3 years after they become sexually activity.    Breast cancer screening generally begins at age 80 with a mammogram and breast exam by your primary care provider.    Beginning at age 63 and continuing to age 76, women should be screened for colorectal cancer.  Certain people may need continued testing until age 64.  Updating vaccinations is part of preventative care.  Vaccinations help protect against diseases such as the flu.  Osteoporosis is a disease in which the bones lose minerals and strength as we age. Women ages 53 and over should discuss this with their caregivers, as should women after menopause who have other risk factors.  Lab tests are generally done as part of preventative care to screen for anemia and blood disorders, to screen for problems with the kidneys and liver, to screen for bladder problems, to check blood sugar, and to check your cholesterol level.  Preventative services generally include counseling about diet, exercise, avoiding tobacco, drugs, excessive alcohol consumption, and sexually transmitted  infections.    Xrays and CT scans are not normally done as a preventative test, and most insurances do not pay for imaging for screening other than as discussed under cancer screens below.   On the other hand, if you have certain medical concerns, imaging may be necessary as a diagnostic test.   Your Medical Team Your medical team starts with [place], your PCP or primary care provider.  Please use our services for your routine care such as physicals, screenings, immunizations, sick visits, and your first stop for general medical concerns.  You can call our number for after hours information for urgent questions that may need attention but cannot wait til the next business day.    Urgent care-urgent cares exist to provide care when your primary care office would typically be closed such as evenings or weekends.   Urgent care is for evaluation of urgent medical problems that do not necessarily require emergency department care, but cannot wait til the next business day when we are open.  Emergency department care-please reserve emergency department care for serious, urgent, possibly life-threatening medical problems.  This includes issues like possible stroke, heart attack, significant injury, mental health crisis, or other urgent need that requires immediate medical attention.     See your dentist office twice yearly for hygiene and cleaning visits.   Brush your teeth and floss your teeth daily.  See your eye doctor yearly for routine eye exam and screenings for glaucoma and retinal disease.  See your gynecologist yearly if you do not have your female/gynecological exams at our  office.       Vaccines:  Stay up to date with your tetanus shots and other required immunizations. You should have a booster for tetanus every 10 years. Be sure to get your flu shot every year, since 5%-20% of the [place] population comes down with the flu. The flu vaccine changes each year, so being vaccinated once is not  enough. Get your shot in the fall, before the flu season peaks.   Other vaccines to consider:  Pneumococcal vaccine to protect against certain types of pneumonia.  This is normally recommended for adults age 46 or older.  However, adults younger than 46 years old with certain underlying conditions such as diabetes, heart or lung disease should also receive the vaccine.  Shingles vaccine to protect against Varicella Zoster if you are older than age 160, or younger than 46 years old with certain underlying illness.  If you have not had the Shingrix vaccine, please call your insurer to inquire about coverage for the Shingrix vaccine given in 2 doses.   Some insurers cover this vaccine after age 46, some cover this after age 46.  If your insurer covers this, then call to schedule appointment to have this vaccine here  Hepatitis A vaccine to protect against a form of infection of the liver by a virus acquired from food.  Hepatitis B vaccine to protect against a form of infection of the liver by a virus acquired from blood or body fluids, particularly if you work in health care.  If you plan to travel internationally, check with your local health department for specific vaccination recommendations.  Human Papilloma Virus or HPV causes cancer of the cervix, and other infections that can be transmitted from person to person. There is a vaccine for HPV, and males should get immunized between the ages of 8111 and 5426. It requires a series of 3 shots.   Covid/Coronavirus - Please consider vaccination for your benefit and to help prevent spread of Covid to those around you.       What should I know about Cancer screening? Many types of cancers can be detected early and may often be prevented. Lung Cancer  You should be screened every year for lung cancer if: ? You are a current smoker who has smoked for at least 30 years. ? You are a former smoker who has quit within the past 15 years.  Talk to your  health care provider about your screening options, when you should start screening, and how often you should be screened.  Breast cancer screening is essential to preventive care for women. All women age 46 and older should perform a breast self-exam every month. At age 46 and older, women should have their caregiver complete a breast exam each year. Women at ages 6840 and older should have a mammogram (x-ray film) of the breasts. Your caregiver can discuss how often you need mammograms.    Breast cancer screening   [facility]   Or    [facility]   [phone]         [phone] [address]       [address] [place], [place] [id number]        [place] [address]  Cervical cancer screening includes taking a Pap smear (sample of cells examined under a microscope) from the cervix (end of the uterus). It also includes testing for HPV (Human Papilloma Virus, which can cause cervical cancer).  Screening and a pelvic exam should begin at age 60, or 3 years after a woman becomes sexually active. Screening should occur every year, with a Pap smear but no HPV testing, up to age 71. After age 42, you should have a Pap smear every 3 years with HPV testing, if no HPV was found previously.   Colorectal Cancer  Routine colorectal cancer screening usually begins at 46 years of age and should be repeated every 5-10 years until you are 46 years old. You may need to be screened more often if early forms of precancerous polyps or small growths are found. Your health care provider may recommend screening at an earlier age if you have risk factors for colon cancer.  Your health care provider may recommend using home test kits to check for hidden blood in the stool.  A small camera at the end of a tube can be used to examine your colon (sigmoidoscopy or colonoscopy). This checks for the earliest forms of colorectal cancer.  Skin Cancer  Check your  skin from head to toe regularly.  Tell your health care provider about any new moles or changes in moles, especially if: ? There is a change in a mole's size, shape, or color. ? You have a mole that is larger than a pencil eraser.  Always use sunscreen. Apply sunscreen liberally and repeat throughout the day.  Protect yourself by wearing long sleeves, pants, a wide-brimmed hat, and sunglasses when outside.    GENERAL RECOMMENDATIONS FOR GOOD HEALTH:  Healthy diet:  Eat a variety of foods, including fruit, vegetables, animal or vegetable protein, such as meat, fish, chicken, and eggs, or beans, lentils, tofu, and grains, such as rice.  Drink plenty of water daily.  Decrease saturated fat in the diet, avoid lots of red meat, processed foods, sweets, fast foods, and fried foods.  Exercise:  Aerobic exercise helps maintain good heart health. At least 30-40 minutes of moderate-intensity exercise is recommended. For example, a brisk walk that increases your heart rate and breathing. This should be done on most days of the week.   Find a type of exercise or a variety of exercises that you enjoy so that it becomes a part of your daily life.  Examples are running, walking, swimming, water aerobics, and biking.  For motivation and support, explore group exercise such as aerobic class, spin class, Zumba, Yoga,or  martial arts, etc.    Set exercise goals for yourself, such as a certain weight goal, walk or run in a race such as a 5k walk/run.  Speak to your primary care provider about exercise goals.  Your weight readings per our records: Wt Readings from Last 3 Encounters:  06/06/20 243 lb (110.2 kg)  10/07/18 224 lb 3.2 oz (101.7 kg)  09/30/17 234 lb 3.2 oz (106.2 kg)    Body mass index is 39.22 kg/m.    Disease prevention:  If you smoke or chew tobacco, find out from your caregiver how to quit. It can literally save your life, no matter how long you have been a tobacco user. If you  do not use tobacco, never begin.   Maintain a healthy diet and normal weight. Increased weight leads to problems with blood pressure and diabetes.   The Body Mass Index or BMI is a way of measuring how much of your body is fat. Having a BMI above 27 increases the risk of heart disease, diabetes, hypertension, stroke and other problems related to obesity. Your caregiver can  help determine your BMI and based on it develop an exercise and dietary program to help you achieve or maintain this important measurement at a healthful level.  High blood pressure causes heart and blood vessel problems.  Persistent high blood pressure should be treated with medicine if weight loss and exercise do not work.  Your blood pressure readings per our records:     BP Readings from Last 3 Encounters:  06/06/20 130/82  10/07/18 120/70  09/30/17 118/76     Fat and cholesterol leaves deposits in your arteries that can block them. This causes heart disease and vessel disease elsewhere in your body.  If your cholesterol is found to be high, or if you have heart disease or certain other medical conditions, then you may need to have your cholesterol monitored frequently and be treated with medication.   Ask if you should have a cardiac stress test if your history suggests this. A stress test is a test done on a treadmill that looks for heart disease. This test can find disease prior to there being a problem.     Menopause can be associated with physical symptoms and risks. Hormone replacement therapy is available to decrease these. You should talk to your caregiver about whether starting or continuing to take hormones is right for you.   Osteoporosis is a disease in which the bones lose minerals and strength as we age. This can result in serious bone fractures. Risk of osteoporosis can be identified using a bone density scan. Women ages 16 and over should discuss this with their caregivers, as should women after menopause  who have other risk factors. Ask your caregiver whether you should be taking a calcium supplement and Vitamin D, to reduce the rate of osteoporosis.   Osteoporosis screening/bone density testing:  [facility]   Or    [facility]   [phone]          [phone] [address]       [address], [address] [place], [place] [address]        [place], [place] [address]    Avoid drinking alcohol in excess (more than two drinks per day).  Avoid use of street drugs. Do not share needles with anyone. Ask for professional help if you need assistance or instructions on stopping the use of alcohol, cigarettes, and/or drugs.  Brush your teeth twice a day with fluoride toothpaste, and floss once a day. Good oral hygiene prevents tooth decay and gum disease. The problems can be painful, unattractive, and can cause other health problems. Visit your dentist for a routine oral and dental check up and preventive care every 6-12 months.    Spiritual and Emotional Health Keeping a healthy spiritual life can help you better manage your physical health. Your spiritual life can help you to cope with any issues that may arise with your physical health.  Balance can keep [place] healthy and help [place] to recover.  If you are struggling with your spiritual health there are questions that you may want to ask yourself:  What makes me feel most complete? When do I feel most connected to the rest of the world? Where do I find the most inner strength? What am I doing when I feel whole?  Helpful tips: . Being in nature. Some people feel very connected and at peace when they are walking outdoors or are outside. Marland Kitchen Helping others. Some feel the largest sense of wellbeing  when they are of service to others. Being of service can take on many forms. It can be doing volunteer work, being kind to strangers, or offering a hand to a friend in need. . Gratitude. Some people find they  feel the most connected when they remain grateful. They may make lists of all the things they are grateful for or say a thank you out loud for all they have.    Emotional Health Are you in tune with your emotional health?  Check out this link: http://www.marquez-love.com/    Legal  Take the time to do a last will and testament, Advanced Directives including Health Care Power of Attorney and Living Will documents.  Don't leave your family with burdens that can be handled ahead of time.   Financial Health . Make sure you use a budget for your personal finances . Make sure you are insured against risks (health insurance, life insurance, auto insurance, etc) . Save more, spend less . Set financial goals . If you need help in this area, good resources include counseling through [employer] or other community resources, have a meeting with a Social research officer, government, and a good resource is [employer] 10 reasons people come to the doctor's office:   (what is your "ounce of prevention")  Skin disorders; Osteoarthritis and joint disorders; Back problems; Cholesterol problems; Upper respiratory conditions, excluding asthma; Anxiety, depression, and bipolar disorder; Chronic neurologic disorders; High blood pressure; Headaches and migraines; and Diabetes.    Safety:  Use seatbelts 100% of the time, whether driving or as a passenger.  Use safety devices such as hearing protection if you work in environments with loud noise or significant background noise.  Use safety glasses when doing any work that could send debris in to the eyes.  Use a helmet if you ride a bike or motorcycle.  Use appropriate safety gear for contact sports.  Talk to your caregiver about gun safety.  Use sunscreen with a SPF (or skin protection factor) of 15 or greater.  Lighter skinned people are at a greater risk of skin cancer. Don't forget to also wear sunglasses in order to protect  your eyes from too much damaging sunlight. Damaging sunlight can accelerate cataract formation.   Keep carbon monoxide and smoke detectors in your home functioning at all times. Change the batteries every 6 months or use a model that plugs into the wall.    Sexual activity: . Sex is a normal part of life and sexual activity can continue into older adulthood for many healthy people.   . If you are having issues related to sexual activity, please follow up to discuss this further.   . If you are not in a monogamous relationship or have more than one partner, please practice safe sex.  Use condoms. Condoms are used for birth control and to help reduce the spread of sexually transmitted infections (or STIs).  Some of the STIs are gonorrhea (the clap), chlamydia, syphilis, trichomonas, herpes, HPV (human papilloma virus) and HIV (human immunodeficiency virus) which causes AIDS. The herpes, HIV and HPV are viral illnesses that have no cure. These can result in disability, cancer and death.   We are able to test for STIs here at our office.

## 2020-06-06 NOTE — Progress Notes (Signed)
Subjective:   HPI  Lindsey Avila is a 44 y.o. female who presents for Chief Complaint  Patient presents with  . Annual Exam    not fasting     Patient Care Team: Vickii Volland, Cleda Mccreedy as PCP - General (Family Medicine) Sees dentist Sees eye doctor Seeing psychiatrist, but moving to new psychiatrist Dr. Shelly Coss Therapist is in [facility] Dr. Waynard Reeds, gynecology   Concerns: Here for physical.  She needs screening for heart disease.  She has recently started taking Adderall for ADD, new diagnosis.  Seeing psychiatry.  She has been on Adderall 1.5 months.  She also has panic attacks periodically.  Last visit here January 2020  She has been particularly fearful of Covid so has not come in since then.  She did get the [employer] vaccine   Past Medical History:  Diagnosis Date  . Anemia    in remote past due to heavy periods  . Chronic back pain   . Depression 2009   prior with pregnancy  . Encounter for routine gynecological examination    Dr. Waynard Reeds  . IUD (intrauterine device) in place    IUD replaced 08/13/2018 with Mirena; Dr. Waynard Reeds  . Obesity   . Recurrent urinary tract infection    no prior urology consult  . Seasonal allergies   . Skin anomaly    keratinosis  . Wears glasses     Family History  Problem Relation Age of Onset  . Hypertension Mother   . Hypothyroidism Mother   . Obesity Mother   . Macular degeneration Mother   . Cataracts Mother   . Cancer Mother        skin cancer  . Hypertension Father   . Cancer Father        basal cell skin  . Benign prostatic hyperplasia Father   . COPD Maternal Grandmother   . Asthma Maternal Grandmother   . Dementia Maternal Grandfather   . Cancer Paternal Grandmother        pancreatic  . Heart disease Paternal Grandfather        MI  . Hypertension Paternal Grandfather   . Stroke Neg Hx   . Diabetes Neg Hx      Current Outpatient Medications:  .   amphetamine-dextroamphetamine (ADDERALL) 20 MG tablet, 20 mg. , Disp: , Rfl:  .  levonorgestrel (MIRENA, 52 MG,) 20 MCG/24HR IUD, Mirena 20 mcg/24 hours (6 yrs) 52 mg intrauterine device  Take 1 device by intrauterine route., Disp: , Rfl:  .  Vitamin D, Ergocalciferol, (DRISDOL) 1.25 MG (50000 UT) CAPS capsule, Take 1 capsule (50,000 Units total) by mouth every 7 (seven) days., Disp: 4 capsule, Rfl: 0  Allergies  Allergen Reactions  . Bupropion Hives  . Contrave [Naltrexone-Bupropion Hcl Er]   . Latex   . Penicillins   . Sulfa Antibiotics      Reviewed their medical, surgical, family, social, medication, and allergy history and updated chart as appropriate.   Review of Systems Constitutional: -fever, -chills, -sweats, -unexpected weight change, -decreased appetite, -fatigue Allergy: -sneezing, -itching, -congestion Dermatology: -changing moles, --rash, -lumps ENT: -runny nose, -ear pain, -sore throat, -hoarseness, -sinus pain, -teeth pain, - ringing in ears, -hearing loss, -nosebleeds Cardiology: -chest pain, -palpitations, -swelling, -difficulty breathing when lying flat, -waking up short of breath Respiratory: -cough, -shortness of breath, -difficulty breathing with exercise or exertion, -wheezing, -coughing up blood Gastroenterology: -abdominal pain, -nausea, -vomiting, -diarrhea, -constipation, -blood in stool, -changes in bowel  movement, -difficulty swallowing or eating Hematology: -bleeding, -bruising  Musculoskeletal: -joint aches, -muscle aches, -joint swelling, -back pain, -neck pain, -cramping, -changes in gait Ophthalmology: denies vision changes, eye redness, itching, discharge Urology: -burning with urination, -difficulty urinating, -blood in urine, -urinary frequency, -urgency, -incontinence Neurology: -headache, -weakness, -tingling, -numbness, -memory loss, -falls, -dizziness Psychology: -depressed mood, -agitation, -sleep problems Breast/gyn: -breast tendnerss,  -discharge, -lumps, -vaginal discharge,- irregular periods, -heavy periods     Objective:  BP 130/82   Pulse 98   Ht 5\' 6"  (1.676 m)   Wt 243 lb (110.2 kg)   SpO2 98%   BMI 39.22 kg/m   General appearance: alert, no distress, WD/WN, Caucasian female Skin: Scattered macules, no worrisome lesions HEENT: normocephalic, conjunctiva/corneas normal, sclerae anicteric, PERRLA, EOMi, nares patent, no discharge or erythema, pharynx normal Oral cavity: MMM, tongue normal, teeth normal Neck: supple, no lymphadenopathy, no thyromegaly, no masses, normal ROM, no bruits Chest: non tender, normal shape and expansion Heart: RRR, normal S1, S2, no murmurs Lungs: CTA bilaterally, no wheezes, rhonchi, or rales Abdomen: +bs, soft, non tender, non distended, no masses, no hepatomegaly, no splenomegaly, no bruits Back: non tender, normal ROM, no scoliosis Musculoskeletal: In bowing of knees, otherwise upper extremities non tender, no obvious deformity, normal ROM throughout, lower extremities non tender, no obvious deformity, normal ROM throughout Extremities: no edema, no cyanosis, no clubbing Pulses: 2+ symmetric, upper and lower extremities, normal cap refill Neurological: alert, oriented x 3, CN2-12 intact, strength normal upper extremities and lower extremities, sensation normal throughout, DTRs 2+ throughout, no cerebellar signs, gait normal Psychiatric: normal affect, behavior normal, pleasant  Breast/gyn/rectal - deferred to gynecology    Assessment and Plan :   Encounter Diagnoses  Name Primary?  . Routine general medical examination at a health care facility Yes  . Need for influenza vaccination   . Class 2 severe obesity with serious comorbidity in adult, unspecified BMI, unspecified obesity type (HCC)   . Vitamin D deficiency   . Attention deficit disorder, unspecified hyperactivity presence   . Screening for diabetes mellitus   . Screening for heart disease   . Vaccine counseling   .  Encounter for hepatitis C screening test for low risk patient     Physical exam - discussed and counseled on healthy lifestyle, diet, exercise, preventative care, vaccinations, sick and well care, proper use of emergency dept and after hours care, and addressed their concerns.    Health screening: Advised they see their eye doctor yearly for routine vision care. Advised they see their dentist yearly for routine dental care including hygiene visits twice yearly. Sees gynecologist yearly  We will request recent Pap and mammogram   Cancer screening Counseled on self breast exams, mammograms, cervical cancer screening  Advise she check insurance for coverage for for screening colonoscopy    Vaccinations: Advised yearly influenza vaccine She is up-to-date on tetanus and Covid vaccine  Counseled on the influenza virus vaccine.  Vaccine information sheet given.  Influenza vaccine given after consent obtained.   separate significant issues discussed: Routine labs today.  Unfortunately her EKG G machine was not working properly today.  We will have her come back later this week for nurse visit for EKG for screening   Berry was seen today for annual exam.  Diagnoses and all orders for this visit:  Routine general medical examination at a health care facility -     Comprehensive metabolic panel -     CBC -     Lipid  panel -     Hemoglobin A1c -     VITAMIN D 25 Hydroxy (Vit-D Deficiency, Fractures) -     HIV Antibody (routine testing w rflx) -     Hepatitis C antibody -     EKG 12-Lead  Need for influenza vaccination  Class 2 severe obesity with serious comorbidity in adult, unspecified BMI, unspecified obesity type (HCC)  Vitamin D deficiency -     VITAMIN D 25 Hydroxy (Vit-D Deficiency, Fractures)  Attention deficit disorder, unspecified hyperactivity presence  Screening for diabetes mellitus -     Hemoglobin A1c  Screening for heart disease -     CBC -     EKG  12-Lead  Vaccine counseling  Encounter for hepatitis C screening test for low risk patient -     Hepatitis C antibody  Other orders -     Flu Vaccine QUAD 6+ mos PF IM (Fluarix Quad PF)    Follow-up pending labs, yearly for physical

## 2020-06-07 DIAGNOSIS — F900 Attention-deficit hyperactivity disorder, predominantly inattentive type: Secondary | ICD-10-CM | POA: Diagnosis not present

## 2020-06-07 LAB — COMPREHENSIVE METABOLIC PANEL
ALT: 24 IU/L (ref 0–32)
AST: 19 IU/L (ref 0–40)
Albumin/Globulin Ratio: 2 (ref 1.2–2.2)
Albumin: 4.6 g/dL (ref 3.8–4.8)
Alkaline Phosphatase: 80 IU/L (ref 44–121)
BUN/Creatinine Ratio: 19 (ref 9–23)
BUN: 16 mg/dL (ref 6–24)
Bilirubin Total: 0.5 mg/dL (ref 0.0–1.2)
CO2: 26 mmol/L (ref 20–29)
Calcium: 9.8 mg/dL (ref 8.7–10.2)
Chloride: 101 mmol/L (ref 96–106)
Creatinine, Ser: 0.86 mg/dL (ref 0.57–1.00)
GFR calc Af Amer: 94 mL/min/{1.73_m2} (ref >59)
GFR calc non Af Amer: 81 mL/min/{1.73_m2} (ref >59)
Globulin, Total: 2.3 g/dL (ref 1.5–4.5)
Glucose: 93 mg/dL (ref 65–99)
Potassium: 4.3 mmol/L (ref 3.5–5.2)
Sodium: 138 mmol/L (ref 134–144)
Total Protein: 6.9 g/dL (ref 6.0–8.5)

## 2020-06-07 LAB — CBC
Hematocrit: 43.8 % (ref 34.0–46.6)
Hemoglobin: 14.9 g/dL (ref 11.1–15.9)
MCH: 28.8 pg (ref 26.6–33.0)
MCHC: 34 g/dL (ref 31.5–35.7)
MCV: 85 fL (ref 79–97)
Platelets: 237 10*3/uL (ref 150–450)
RBC: 5.18 x10E6/uL (ref 3.77–5.28)
RDW: 12.6 % (ref 11.7–15.4)
WBC: 10.1 10*3/uL (ref 3.4–10.8)

## 2020-06-07 LAB — HIV ANTIBODY (ROUTINE TESTING W REFLEX): HIV Screen 4th Generation wRfx: NONREACTIVE

## 2020-06-07 LAB — LIPID PANEL
Chol/HDL Ratio: 3.8 ratio (ref 0.0–4.4)
Cholesterol, Total: 171 mg/dL (ref 100–199)
HDL: 45 mg/dL (ref >39)
LDL Chol Calc (NIH): 105 mg/dL — ABNORMAL HIGH (ref 0–99)
Triglycerides: 119 mg/dL (ref 0–149)
VLDL Cholesterol Cal: 21 mg/dL (ref 5–40)

## 2020-06-07 LAB — HEPATITIS C ANTIBODY: Hep C Virus Ab: <0.1 s/co ratio (ref 0.0–0.9)

## 2020-06-07 LAB — HEMOGLOBIN A1C
Est. average glucose Bld gHb Est-mCnc: 88 mg/dL
Hgb A1c MFr Bld: 4.7 % — ABNORMAL LOW (ref 4.8–5.6)

## 2020-06-07 LAB — VITAMIN D 25 HYDROXY (VIT D DEFICIENCY, FRACTURES): Vit D, 25-Hydroxy: 44.4 ng/mL (ref 30.0–100.0)

## 2020-06-16 ENCOUNTER — Other Ambulatory Visit: Payer: BC Managed Care – PPO

## 2020-06-17 ENCOUNTER — Telehealth: Payer: Self-pay | Admitting: Medical

## 2020-06-17 NOTE — Telephone Encounter (Signed)
Received requested records from [facility] 

## 2020-06-20 ENCOUNTER — Other Ambulatory Visit: Payer: Self-pay

## 2020-06-20 ENCOUNTER — Other Ambulatory Visit (INDEPENDENT_AMBULATORY_CARE_PROVIDER_SITE_OTHER): Payer: BC Managed Care – PPO

## 2020-06-20 ENCOUNTER — Encounter: Payer: Self-pay | Admitting: Medical

## 2020-06-20 DIAGNOSIS — Z136 Encounter for screening for cardiovascular disorders: Secondary | ICD-10-CM | POA: Diagnosis not present

## 2020-06-20 DIAGNOSIS — Z79899 Other long term (current) drug therapy: Secondary | ICD-10-CM

## 2020-06-20 NOTE — Progress Notes (Signed)
EKG reviewed, normal

## 2020-07-22 ENCOUNTER — Encounter: Payer: Self-pay | Admitting: Medical

## 2020-07-28 ENCOUNTER — Encounter: Payer: Self-pay | Admitting: Medical

## 2020-08-02 DIAGNOSIS — F900 Attention-deficit hyperactivity disorder, predominantly inattentive type: Secondary | ICD-10-CM | POA: Diagnosis not present

## 2020-08-29 DIAGNOSIS — F900 Attention-deficit hyperactivity disorder, predominantly inattentive type: Secondary | ICD-10-CM | POA: Diagnosis not present

## 2020-09-21 DIAGNOSIS — Z20822 Contact with and (suspected) exposure to covid-19: Secondary | ICD-10-CM | POA: Diagnosis not present

## 2020-11-15 DIAGNOSIS — Z6838 Body mass index (BMI) 38.0-38.9, adult: Secondary | ICD-10-CM | POA: Diagnosis not present

## 2020-11-15 DIAGNOSIS — Z1231 Encounter for screening mammogram for malignant neoplasm of breast: Secondary | ICD-10-CM | POA: Diagnosis not present

## 2020-11-15 DIAGNOSIS — Z01419 Encounter for gynecological examination (general) (routine) without abnormal findings: Secondary | ICD-10-CM | POA: Diagnosis not present

## 2020-11-24 ENCOUNTER — Encounter: Payer: Self-pay | Admitting: Medical

## 2020-12-09 ENCOUNTER — Telehealth: Payer: Self-pay

## 2020-12-09 NOTE — Telephone Encounter (Signed)
Spoke to pt and advised that we needed to have more info to close the covid vaccine  gap. Pt was also reminded to contact her insurance company to find out if they cover a cologaurd or colonoscopy. Pt was also advised to call back and schedule a annual exam in late sept. KH

## 2021-02-21 DIAGNOSIS — F900 Attention-deficit hyperactivity disorder, predominantly inattentive type: Secondary | ICD-10-CM | POA: Diagnosis not present

## 2021-06-05 DIAGNOSIS — F900 Attention-deficit hyperactivity disorder, predominantly inattentive type: Secondary | ICD-10-CM | POA: Diagnosis not present

## 2021-06-08 ENCOUNTER — Encounter: Payer: BC Managed Care – PPO | Admitting: Medical

## 2021-06-23 ENCOUNTER — Encounter: Payer: Self-pay | Admitting: Physician Assistant

## 2021-06-23 ENCOUNTER — Telehealth: Payer: Self-pay

## 2021-06-23 ENCOUNTER — Other Ambulatory Visit: Payer: Self-pay

## 2021-06-23 ENCOUNTER — Ambulatory Visit (INDEPENDENT_AMBULATORY_CARE_PROVIDER_SITE_OTHER): Payer: BC Managed Care – PPO | Admitting: Physician Assistant

## 2021-06-23 VITALS — BP 110/76 | HR 84 | Temp 97.9°F | Ht 66.0 in | Wt 237.2 lb

## 2021-06-23 DIAGNOSIS — E785 Hyperlipidemia, unspecified: Secondary | ICD-10-CM | POA: Diagnosis not present

## 2021-06-23 DIAGNOSIS — E559 Vitamin D deficiency, unspecified: Secondary | ICD-10-CM | POA: Diagnosis not present

## 2021-06-23 DIAGNOSIS — F988 Other specified behavioral and emotional disorders with onset usually occurring in childhood and adolescence: Secondary | ICD-10-CM | POA: Diagnosis not present

## 2021-06-23 DIAGNOSIS — Z0000 Encounter for general adult medical examination without abnormal findings: Secondary | ICD-10-CM | POA: Diagnosis not present

## 2021-06-23 LAB — LIPID PANEL
Cholesterol: 175 mg/dL (ref 0–200)
HDL: 49.3 mg/dL (ref >39.00)
LDL Cholesterol: 108 mg/dL — ABNORMAL HIGH (ref 0–99)
NonHDL: 125.42
Total CHOL/HDL Ratio: 4
Triglycerides: 87 mg/dL (ref 0.0–149.0)
VLDL: 17.4 mg/dL (ref 0.0–40.0)

## 2021-06-23 LAB — CBC WITH DIFFERENTIAL/PLATELET
Basophils Absolute: 0 10*3/uL (ref 0.0–0.1)
Basophils Relative: 0.6 % (ref 0.0–3.0)
Eosinophils Absolute: 0.1 10*3/uL (ref 0.0–0.7)
Eosinophils Relative: 1.1 % (ref 0.0–5.0)
HCT: 42 % (ref 36.0–46.0)
Hemoglobin: 14.1 g/dL (ref 12.0–15.0)
Lymphocytes Relative: 22.6 % (ref 12.0–46.0)
Lymphs Abs: 1.6 10*3/uL (ref 0.7–4.0)
MCHC: 33.5 g/dL (ref 30.0–36.0)
MCV: 83.9 fl (ref 78.0–100.0)
Monocytes Absolute: 0.4 10*3/uL (ref 0.1–1.0)
Monocytes Relative: 5.8 % (ref 3.0–12.0)
Neutro Abs: 5 10*3/uL (ref 1.4–7.7)
Neutrophils Relative %: 69.9 % (ref 43.0–77.0)
Platelets: 194 10*3/uL (ref 150.0–400.0)
RBC: 5.01 Mil/uL (ref 3.87–5.11)
RDW: 13.8 % (ref 11.5–15.5)
WBC: 7.2 10*3/uL (ref 4.0–10.5)

## 2021-06-23 LAB — VITAMIN D 25 HYDROXY (VIT D DEFICIENCY, FRACTURES): VITD: 30.3 ng/mL (ref 30.00–100.00)

## 2021-06-23 LAB — COMPREHENSIVE METABOLIC PANEL
ALT: 17 U/L (ref 0–35)
AST: 18 U/L (ref 0–37)
Albumin: 4.1 g/dL (ref 3.5–5.2)
Alkaline Phosphatase: 65 U/L (ref 39–117)
BUN: 20 mg/dL (ref 6–23)
CO2: 29 mEq/L (ref 19–32)
Calcium: 8.7 mg/dL (ref 8.4–10.5)
Chloride: 103 mEq/L (ref 96–112)
Creatinine, Ser: 0.77 mg/dL (ref 0.40–1.20)
GFR: 92.02 mL/min (ref >60.00)
Glucose, Bld: 98 mg/dL (ref 70–99)
Potassium: 4.1 mEq/L (ref 3.5–5.1)
Sodium: 138 mEq/L (ref 135–145)
Total Bilirubin: 0.7 mg/dL (ref 0.2–1.2)
Total Protein: 6.8 g/dL (ref 6.0–8.3)

## 2021-06-23 MED ORDER — TIRZEPATIDE 2.5 MG/0.5ML ~~LOC~~ SOAJ: 2.5000 mg | SUBCUTANEOUS | 1 refills | Status: DC

## 2021-06-23 MED ORDER — EPINEPHRINE 0.3 MG/0.3ML IJ SOAJ: 0.3000 mg | INTRAMUSCULAR | 0 refills | Status: AC | PRN

## 2021-06-23 NOTE — Telephone Encounter (Signed)
Patient called in stating that her EPI-PEN's are costing her $200 and is wondering what she should do.

## 2021-06-23 NOTE — Progress Notes (Signed)
Lindsey Avila is a 45 y.o. female here to establish care.  I acted as a Neurosurgeon for [employer], PA-C Corky Mull, LPN   History of Present Illness:   Chief Complaint  Patient presents with   Establish Care   Annual Exam    Fasting    Acute Concerns: None  Chronic Issues: Obesity -- did lose weight (10 lb) when starting adderall. Goal would like to be 180 lb. Would ultimately like a breast reduction. Has tried: Weight Watchers, phentermine, Noom, Wellbutrin, Contrave. Accountability typically works the best for her. Was vegetarian and thought that was when she was her healthiest. Does struggle with comfort eating. ADHD -- dx last year. Did trial Vyvanse up to 40 mg and did not feel like this was effective for her focus.  Currently prescribed adderall 20 mg er in AM and 20 mg adderall at 1p but does not take this regularly.  Wt Readings from Last 4 Encounters:  06/23/21 237 lb 4 oz (107.6 kg)  06/06/20 243 lb (110.2 kg)  10/07/18 224 lb 3.2 oz (101.7 kg)  09/30/17 234 lb 3.2 oz (106.2 kg)   HLD -- hx of this, never on meds. LDL usually borderline elevated.  Vit D def -- would like Vit D rechecked today. Does not take regular supplement.  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; sedentary desk job Weight -- Weight: 237 lb 4 oz (107.6 kg)  Mood -- overall stable Alcohol -- rare Tobacco -- none  Depression screen PHQ 2/9 06/23/2021  Decreased Interest 1  Down, Depressed, Hopeless 1  PHQ - 2 Score 2  Altered sleeping 3  Tired, decreased energy 1  Change in appetite 2  Feeling bad or failure about yourself  1  Trouble concentrating 3  Moving slowly or fidgety/restless 0  Suicidal thoughts 0  PHQ-9 Score  12  Difficult doing work/chores Somewhat difficult    GAD 7 : Generalized Anxiety Score 06/23/2021  Nervous, Anxious, on Edge 1  Control/stop worrying 2  Worry too much - different things 2  Trouble relaxing 1  Restless 0  Easily annoyed or irritable 1  Afraid - awful might happen 3  Total GAD 7 Score 10  Anxiety Difficulty Somewhat difficult     Other providers/specialists: Patient Care Team: Jarold Motto, [place] as PCP - General (Physician Assistant)   Past Medical History:  Diagnosis Date   ADHD (attention deficit hyperactivity disorder) 05/2020   Anemia    in remote past due to heavy periods   Anxiety    Chronic back pain    Depression 2009   prior with pregnancy   Encounter for routine gynecological examination    Dr. Waynard Reeds   IUD (intrauterine device) in place    IUD replaced 08/13/2018 with Mirena; Dr. Waynard Reeds   Obesity    Recurrent urinary tract infection    no prior urology consult   Seasonal allergies    Skin anomaly    keratinosis   Wears glasses      Social History   Tobacco Use   Smoking status: Never   Smokeless tobacco: Never  Vaping Use   Vaping Use: Never used  Substance Use Topics   Alcohol use: Not Currently    Comment: rarely  Drug use: No    Past Surgical History:  Procedure Laterality Date   CESAREAN SECTION  2009   KNEE ARTHROSCOPY     left, arthroscopic    Family History  Problem Relation Age of Onset   Hypertension Mother    Hypothyroidism Mother    Obesity Mother    Macular degeneration Mother    Cataracts Mother    Cancer Mother        skin cancer   Hypertension Father    Cancer Father        basal cell skin   Benign prostatic hyperplasia Father    COPD Maternal Grandmother    Asthma Maternal Grandmother    Dementia Maternal Grandfather    Cancer Paternal Grandmother        pancreatic   Heart disease Paternal Grandfather        MI   Hypertension Paternal Grandfather    Stroke Neg Hx    Diabetes  Neg Hx     Allergies  Allergen Reactions   Sulfa Antibiotics Anaphylaxis and Hives   Bupropion Hives   Contrave [Naltrexone-Bupropion Hcl Er] Hives   Latex    Penicillins     unknown     Current Medications:   Current Outpatient Medications:    ALPRAZolam (XANAX) 0.25 MG tablet, alprazolam 0.25 mg tablet, Disp: , Rfl:    amphetamine-dextroamphetamine (ADDERALL) 20 MG tablet, 20 mg. , Disp: , Rfl:    EPINEPHrine 0.3 mg/0.3 mL IJ SOAJ injection, Inject 0.3 mg into the muscle as needed for anaphylaxis., Disp: 2 each, Rfl: 0   levonorgestrel (MIRENA, 52 MG,) 20 MCG/24HR IUD, Mirena 20 mcg/24 hours (6 yrs) 52 mg intrauterine device  Take 1 device by intrauterine route., Disp: , Rfl:    tirzepatide (MOUNJARO) 2.5 MG/0.5ML Pen, Inject 2.5 mg into the skin once a week., Disp: 6 mL, Rfl: 1   Review of Systems:   Review of Systems  Constitutional:  Negative for chills, fever, malaise/fatigue and weight loss.  HENT:  Negative for hearing loss, sinus pain and sore throat.   Respiratory:  Negative for cough and hemoptysis.   Cardiovascular:  Negative for chest pain, palpitations, leg swelling and PND.  Gastrointestinal:  Negative for abdominal pain, constipation, diarrhea, heartburn, nausea and vomiting.  Genitourinary:  Negative for dysuria, frequency and urgency.  Musculoskeletal:  Negative for back pain, myalgias and neck pain.  Skin:  Negative for itching and rash.  Neurological:  Negative for dizziness, tingling, seizures and headaches.  Endo/Heme/Allergies:  Negative for polydipsia.  Psychiatric/Behavioral:  Negative for depression. The patient is not nervous/anxious.    Vitals:   Vitals:   06/23/21 0807  BP: 110/76  Pulse: 84  Temp: 97.9 F (36.6 C)  TempSrc: Temporal  SpO2: 98%  Weight: 237 lb 4 oz (107.6 kg)  Height: 5\' 6"  (1.676 m)      Body mass index is 38.29 kg/m.  Physical Exam:   Physical Exam Vitals and nursing note reviewed.  Constitutional:       General: She is not in acute distress.    Appearance: Normal appearance. She is well-developed. She is not ill-appearing or toxic-appearing.  HENT:     Head: Normocephalic and atraumatic.     Right Ear: Tympanic membrane, ear canal and external ear normal. Tympanic membrane is not erythematous, retracted or bulging.     Left Ear: Tympanic membrane, ear canal and external ear normal. Tympanic membrane is not erythematous, retracted or bulging.  Eyes:  General: Lids are normal.     Conjunctiva/sclera: Conjunctivae normal.     Pupils: Pupils are equal, round, and reactive to light.  Neck:     Trachea: Trachea normal.  Cardiovascular:     Rate and Rhythm: Normal rate and regular rhythm.     Heart sounds: Normal heart sounds, S1 normal and S2 normal.  Pulmonary:     Effort: Pulmonary effort is normal. No tachypnea or respiratory distress.     Breath sounds: Normal breath sounds. No decreased breath sounds, wheezing, rhonchi or rales.  Abdominal:     General: Bowel sounds are normal.     Palpations: Abdomen is soft.     Tenderness: There is no abdominal tenderness.  Musculoskeletal:        General: Normal range of motion.     Cervical back: Full passive range of motion without pain.  Lymphadenopathy:     Cervical: No cervical adenopathy.  Skin:    General: Skin is warm and dry.  Neurological:     Mental Status: She is alert.     GCS: GCS eye subscore is 4. GCS verbal subscore is 5. GCS motor subscore is 6.     Cranial Nerves: No cranial nerve deficit.     Sensory: No sensory deficit.     Deep Tendon Reflexes: Reflexes are normal and symmetric.  Psychiatric:        Speech: Speech normal.        Behavior: Behavior normal. Behavior is cooperative.     Assessment and Plan:   Davita was seen today for establish care and annual exam.  Diagnoses and all orders for this visit:  Routine physical examination Today patient counseled on age appropriate routine health concerns for  screening and prevention, each reviewed and up to date or declined. Immunizations reviewed and up to date or declined. Labs ordered and reviewed. Risk factors for depression reviewed and negative. Hearing function and visual acuity are intact. ADLs screened and addressed as needed. Functional ability and level of safety reviewed and appropriate. Education, counseling and referrals performed based on assessed risks today. Patient provided with a copy of personalized plan for preventive services.  Vitamin D deficiency Update Vit D and provide recommendations accordingly. -     VITAMIN D 25 Hydroxy (Vit-D Deficiency, Fractures)  Class 2 severe obesity with serious comorbidity in adult, unspecified BMI, unspecified obesity type (HCC) Continue to work on healthy eating and exercise -     CBC with Differential/Platelet -     Comprehensive metabolic panel  Attention deficit disorder, unspecified hyperactivity presence Well controlled overall She is going to get [place] records and we may take over if we receive them Will send mychart message once we get them  Hyperlipidemia, unspecified hyperlipidemia type Update lipid panel and provide recommendations accordingly -     Lipid panel  Other orders -     tirzepatide [facility]) 2.5 MG/0.5ML Pen; Inject 2.5 mg into the skin once a week. -     EPINEPHrine 0.3 mg/0.3 mL IJ SOAJ injection; Inject 0.3 mg into the muscle as needed for anaphylaxis.  CMA or LPN served as scribe during this visit. History, Physical, and Plan performed by medical provider. The above documentation has been reviewed and is accurate and complete.   Jarold Motto, PA-C

## 2021-06-23 NOTE — Patient Instructions (Addendum)
It was great to see you!  Follow-up in 1 month virtually. I'll send you mychart message re: mounjaro.  Please go to the lab for blood work.   Our office will call you with your results unless you have chosen to receive results via MyChart.  If your blood work is normal we will follow-up each year for physicals and as scheduled for chronic medical problems.  If anything is abnormal we will treat accordingly and get you in for a follow-up.  Take care,  Lelon Mast

## 2021-06-23 NOTE — Telephone Encounter (Signed)
Patient is calling in wanting to know if okay to wait until Monday to take the medication.

## 2021-06-26 NOTE — Telephone Encounter (Signed)
See MyChart message

## 2021-06-27 ENCOUNTER — Encounter: Payer: Self-pay | Admitting: Physician Assistant

## 2021-07-03 ENCOUNTER — Other Ambulatory Visit: Payer: Self-pay | Admitting: Physician Assistant

## 2021-07-03 DIAGNOSIS — Z1211 Encounter for screening for malignant neoplasm of colon: Secondary | ICD-10-CM

## 2021-07-11 ENCOUNTER — Encounter: Payer: Self-pay | Admitting: Gastroenterology

## 2021-07-19 ENCOUNTER — Encounter: Payer: Self-pay | Admitting: Physician Assistant

## 2021-07-19 ENCOUNTER — Ambulatory Visit: Payer: BC Managed Care – PPO | Admitting: Physician Assistant

## 2021-07-19 ENCOUNTER — Telehealth (INDEPENDENT_AMBULATORY_CARE_PROVIDER_SITE_OTHER): Payer: BC Managed Care – PPO | Admitting: Physician Assistant

## 2021-07-19 VITALS — Ht 66.0 in | Wt 228.4 lb

## 2021-07-19 DIAGNOSIS — F988 Other specified behavioral and emotional disorders with onset usually occurring in childhood and adolescence: Secondary | ICD-10-CM

## 2021-07-19 DIAGNOSIS — F419 Anxiety disorder, unspecified: Secondary | ICD-10-CM

## 2021-07-19 MED ORDER — ESCITALOPRAM OXALATE 10 MG PO TABS
10.0000 mg | ORAL_TABLET | Freq: Every day | ORAL | 1 refills | Status: DC
Start: 2021-07-19 — End: 2021-09-20

## 2021-07-19 MED ORDER — AMPHETAMINE-DEXTROAMPHETAMINE 20 MG PO TABS: 20.0000 mg | ORAL_TABLET | Freq: Every day | ORAL | 0 refills | Status: DC

## 2021-07-19 NOTE — Progress Notes (Incomplete)
Lindsey Avila is a 45 y.o. female here for ADD.   SCRIBE STATEMENT  History of Present Illness:   No chief complaint on file.   HPI  ADD  Currently compliant with taking Adderall 20 mg and xanax 0.25 mg with no adverse effects.   Past Medical History:  Diagnosis Date   ADHD (attention deficit hyperactivity disorder) 05/2020   Anemia    in remote past due to heavy periods   Anxiety    Chronic back pain    Depression 2009   prior with pregnancy   Encounter for routine gynecological examination    Dr. Waynard Reeds   IUD (intrauterine device) in place    IUD replaced 08/13/2018 with Mirena; Dr. Waynard Reeds   Obesity    Recurrent urinary tract infection    no prior urology consult   Seasonal allergies    Skin anomaly    keratinosis   Wears glasses      Social History   Tobacco Use   Smoking status: Never   Smokeless tobacco: Never  Vaping Use   Vaping Use: Never used  Substance Use Topics   Alcohol use: Not Currently    Comment: rarely   Drug use: No    Past Surgical History:  Procedure Laterality Date   CESAREAN SECTION  2009   KNEE ARTHROSCOPY     left, arthroscopic    Family History  Problem Relation Age of Onset   Hypertension Mother    Hypothyroidism Mother    Obesity Mother    Macular degeneration Mother    Cataracts Mother    Cancer Mother        skin cancer   Hypertension Father    Cancer Father        basal cell skin   Benign prostatic hyperplasia Father    COPD Maternal Grandmother    Asthma Maternal Grandmother    Dementia Maternal Grandfather    Cancer Paternal Grandmother        pancreatic   Heart disease Paternal Grandfather        MI   Hypertension Paternal Grandfather    Stroke Neg Hx    Diabetes Neg Hx     Allergies  Allergen Reactions   Sulfa Antibiotics Anaphylaxis and Hives   Bupropion Hives   Contrave [Naltrexone-Bupropion Hcl Er] Hives   Latex    Penicillins     unknown    Current Medications:   Current  Outpatient Medications:    ALPRAZolam (XANAX) 0.25 MG tablet, alprazolam 0.25 mg tablet, Disp: , Rfl:    amphetamine-dextroamphetamine (ADDERALL) 20 MG tablet, 20 mg. , Disp: , Rfl:    EPINEPHrine 0.3 mg/0.3 mL IJ SOAJ injection, Inject 0.3 mg into the muscle as needed for anaphylaxis., Disp: 2 each, Rfl: 0   levonorgestrel (MIRENA, 52 MG,) 20 MCG/24HR IUD, Mirena 20 mcg/24 hours (6 yrs) 52 mg intrauterine device  Take 1 device by intrauterine route., Disp: , Rfl:    tirzepatide (MOUNJARO) 2.5 MG/0.5ML Pen, Inject 2.5 mg into the skin once a week., Disp: 6 mL, Rfl: 1   Review of Systems:   ROS Negative unless otherwise specified per HPI. Vitals:   There were no vitals filed for this visit.   There is no height or weight on file to calculate BMI.  Physical Exam:   Physical Exam  Assessment and Plan:      I,Havlyn C Ratchford,acting as a scribe for Jarold Motto, PA.,have documented all relevant documentation on the behalf of  Jarold Motto, PA,as directed by  Jarold Motto, PA while in the presence of [place], [place].  ***  Jarold Motto, [place]

## 2021-07-19 NOTE — Progress Notes (Signed)
Virtual Visit via Video   I connected with Lindsey Avila on 07/19/21 at  8:00 AM EDT by a video enabled telemedicine application and verified that I am speaking with the correct person using two identifiers. Location patient: Home Location provider: [facility], Office Persons participating in the virtual visit: Callyn Severtson, Jarold Motto PA-C, Corky Mull, LPN   I discussed the limitations of evaluation and management by telemedicine and the availability of in person appointments. The patient expressed understanding and agreed to proceed.  I acted as a Neurosurgeon for [employer], [employer], LPN   Subjective:   HPI:   ADD Currently taking Adderall 20 mg tablet. She is not sure if the dose is working for her right now. Trouble focusing at work. She started on Vyvanse 20 mg daily and then was increased to 40 mg daily. This caused severe HA. She was then trialed on Adderall XR 20 mg but didn't feel like it made much of a difference.  She is currently on Adderall 20 mg daily and has the option to take another Adderall 20 mg daily in the afternoon but she does not do this.  Anxiety She has been on a few medications in the past and feels like she would benefit from a daily SSRI.  Has had good results with Lexapro in the past and would like to restart this.  She had side effects with Wellbutrin and Celexa.  Denies any suicidal or homicidal ideation at this time.  Obesity Currently using Mounjaro 2.5 mg weekly, no adverse reactions. She is liking the medication, has lost 8 pounds.  She feels like this medication helps to quiet her mind in regards to over thinking about food.   Wt Readings from Last 4 Encounters:  07/19/21 228 lb 6.1 oz (103.6 kg)  06/23/21 237 lb 4 oz (107.6 kg)  06/06/20 243 lb (110.2 kg)  10/07/18 224 lb 3.2 oz (101.7 kg)       ROS: See pertinent positives and negatives per HPI.  Patient Active Problem List   Diagnosis Date Noted   Attention  deficit disorder 06/06/2020   Vitamin D deficiency 10/07/2018   Chronic pain of right knee 10/07/2018   Obesity with serious comorbidity 08/23/2017    Social History   Tobacco Use   Smoking status: Never   Smokeless tobacco: Never  Substance Use Topics   Alcohol use: Not Currently    Comment: rarely    Current Outpatient Medications:    ALPRAZolam (XANAX) 0.25 MG tablet, alprazolam 0.25 mg tablet, Disp: , Rfl:    amphetamine-dextroamphetamine (ADDERALL) 20 MG tablet, Take 1 tablet (20 mg total) by mouth daily., Disp: 30 tablet, Rfl: 0   EPINEPHrine 0.3 mg/0.3 mL IJ SOAJ injection, Inject 0.3 mg into the muscle as needed for anaphylaxis., Disp: 2 each, Rfl: 0   escitalopram (LEXAPRO) 10 MG tablet, Take 1 tablet (10 mg total) by mouth daily., Disp: 30 tablet, Rfl: 1   FIBER ADULT GUMMIES PO, Take 2 each by mouth daily in the afternoon., Disp: , Rfl:    levonorgestrel (MIRENA, 52 MG,) 20 MCG/24HR IUD, Mirena 20 mcg/24 hours (6 yrs) 52 mg intrauterine device  Take 1 device by intrauterine route., Disp: , Rfl:    MAGNESIUM CITRATE PO, Take 200 mg by mouth daily in the afternoon., Disp: , Rfl:    tirzepatide (MOUNJARO) 2.5 MG/0.5ML Pen, Inject 2.5 mg into the skin once a week., Disp: 6 mL, Rfl: 1  Allergies  Allergen Reactions  Sulfa Antibiotics Anaphylaxis and Hives   Bupropion Hives   Contrave [Naltrexone-Bupropion Hcl Er] Hives   Latex    Penicillins     unknown    Objective:   VITALS: Per patient if applicable, see vitals. GENERAL: Alert, appears well and in no acute distress. HEENT: Atraumatic, conjunctiva clear, no obvious abnormalities on inspection of external nose and ears. NECK: Normal movements of the head and neck. CARDIOPULMONARY: No increased WOB. Speaking in clear sentences. I:E ratio WNL.  MS: Moves all visible extremities without noticeable abnormality. PSYCH: Pleasant and cooperative, well-groomed. Speech normal rate and rhythm. Affect is appropriate. Insight  and judgement are appropriate. Attention is focused, linear, and appropriate.  NEURO: CN grossly intact. Oriented as arrived to appointment on time with no prompting. Moves both UE equally.  SKIN: No obvious lesions, wounds, erythema, or cyanosis noted on face or hands.  Assessment and Plan:   Christain was seen today for add and obesity.  Diagnoses and all orders for this visit:  Attention deficit disorder, unspecified hyperactivity presence Uncontrolled Lengthy conversation about this today with patient Initially we were going to trial Adderall 30 mg extended release but she would first like to trial medication for anxiety to see if that improves some baseline symptoms, will which would then in turn improve her ability to focus Will refill current Adderall 20 mg daily at this time Follow-up in 1 month, sooner if concerns.  Anxiety Uncontrolled Start Lexapro 10 mg daily Follow-up in 1 month, sooner if concerns  Class 2 severe obesity with serious comorbidity in adult, unspecified BMI, unspecified obesity type (HCC) Improving We are going to continue current dosage of Mounjaro 2.5 mg weekly Follow-up in 1 month for possible dose increase  Other orders -     escitalopram (LEXAPRO) 10 MG tablet; Take 1 tablet (10 mg total) by mouth daily. -     amphetamine-dextroamphetamine (ADDERALL) 20 MG tablet; Take 1 tablet (20 mg total) by mouth daily.   I discussed the assessment and treatment plan with the patient. The patient was provided an opportunity to ask questions and all were answered. The patient agreed with the plan and demonstrated an understanding of the instructions.   The patient was advised to call back or seek an in-person evaluation if the symptoms worsen or if the condition fails to improve as anticipated.   CMA or LPN served as scribe during this visit. History, Physical, and Plan performed by medical provider. The above documentation has been reviewed and is accurate and  complete.   [place], [place] 07/19/2021

## 2021-07-20 ENCOUNTER — Other Ambulatory Visit: Payer: Self-pay

## 2021-07-20 NOTE — Telephone Encounter (Signed)
Pt called needing a refill of Adderall. Pt stated that none of her pharmacies has it right now. She would like prescription to be sent to [facility] [address], [place], [place] [address]. Please Advise.

## 2021-07-21 MED ORDER — AMPHETAMINE-DEXTROAMPHETAMINE 20 MG PO TABS: 20.0000 mg | ORAL_TABLET | Freq: Every day | ORAL | 0 refills | Status: DC

## 2021-07-21 NOTE — Telephone Encounter (Signed)
Please see message. I have loaded the cart with correct pharmacy.

## 2021-07-21 NOTE — Telephone Encounter (Signed)
Pt notified Rx for Adderall sent to [facility] as requested.

## 2021-07-24 ENCOUNTER — Telehealth: Payer: BC Managed Care – PPO | Admitting: Physician Assistant

## 2021-07-27 ENCOUNTER — Encounter: Payer: Self-pay | Admitting: Physician Assistant

## 2021-07-27 NOTE — Telephone Encounter (Signed)
Lindsey Avila okay to increase Mounjaro to 5 mg?

## 2021-07-28 MED ORDER — TIRZEPATIDE 5 MG/0.5ML ~~LOC~~ SOAJ: 5.0000 mg | SUBCUTANEOUS | 0 refills | Status: DC

## 2021-07-31 ENCOUNTER — Encounter: Payer: Self-pay | Admitting: Gastroenterology

## 2021-07-31 ENCOUNTER — Other Ambulatory Visit: Payer: Self-pay

## 2021-07-31 ENCOUNTER — Ambulatory Visit (AMBULATORY_SURGERY_CENTER): Payer: Self-pay

## 2021-07-31 VITALS — Ht 66.0 in | Wt 227.0 lb

## 2021-07-31 DIAGNOSIS — Z1211 Encounter for screening for malignant neoplasm of colon: Secondary | ICD-10-CM

## 2021-07-31 MED ORDER — PLENVU 140 G PO SOLR: 1.0000 | Freq: Once | ORAL | 0 refills | Status: AC

## 2021-07-31 NOTE — Progress Notes (Signed)
Denies allergies to eggs or soy products. Denies complication of anesthesia or sedation. Denies use of weight loss medication. Denies use of O2.   Emmi instructions given for colonoscopy.  

## 2021-08-13 ENCOUNTER — Other Ambulatory Visit: Payer: Self-pay | Admitting: Physician Assistant

## 2021-08-14 ENCOUNTER — Encounter: Payer: BC Managed Care – PPO | Admitting: Gastroenterology

## 2021-08-14 ENCOUNTER — Other Ambulatory Visit: Payer: Self-pay | Admitting: Physician Assistant

## 2021-08-14 MED ORDER — AMPHETAMINE-DEXTROAMPHETAMINE 20 MG PO TABS: 20.0000 mg | ORAL_TABLET | Freq: Every day | ORAL | 0 refills | Status: DC

## 2021-08-14 MED ORDER — TIRZEPATIDE 5 MG/0.5ML ~~LOC~~ SOAJ: 5.0000 mg | SUBCUTANEOUS | 0 refills | Status: DC

## 2021-08-17 ENCOUNTER — Telehealth: Payer: Self-pay | Admitting: Gastroenterology

## 2021-08-17 MED ORDER — SUTAB 1479-225-188 MG PO TABS: ORAL_TABLET | ORAL | 0 refills | Status: DC

## 2021-08-17 NOTE — Telephone Encounter (Signed)
Patient called stating that she would like to switch her prep medication to the Sutab. If you could please advise.

## 2021-08-17 NOTE — Telephone Encounter (Signed)
Patient was a direct and had a previsit with Harriett Sine and given PlenVu

## 2021-08-17 NOTE — Telephone Encounter (Signed)
Called pt back. Sent in [place] and sending her new instructions in My Chart

## 2021-08-18 ENCOUNTER — Encounter: Payer: Self-pay | Admitting: Physician Assistant

## 2021-08-18 NOTE — Telephone Encounter (Signed)
ok 

## 2021-08-23 ENCOUNTER — Other Ambulatory Visit: Payer: Self-pay

## 2021-08-23 ENCOUNTER — Ambulatory Visit (AMBULATORY_SURGERY_CENTER): Payer: BC Managed Care – PPO | Admitting: Gastroenterology

## 2021-08-23 ENCOUNTER — Encounter: Payer: Self-pay | Admitting: Gastroenterology

## 2021-08-23 VITALS — BP 95/59 | HR 74 | Temp 97.8°F | Resp 17 | Ht 66.0 in | Wt 227.0 lb

## 2021-08-23 DIAGNOSIS — Z1211 Encounter for screening for malignant neoplasm of colon: Secondary | ICD-10-CM | POA: Diagnosis not present

## 2021-08-23 MED ORDER — SODIUM CHLORIDE 0.9 % IV SOLN: 500.0000 mL | Freq: Once | INTRAVENOUS | Status: DC

## 2021-08-23 NOTE — Progress Notes (Signed)
Vitals-DT  Pt's states no medical or surgical changes since previsit or office visit.  

## 2021-08-23 NOTE — Progress Notes (Signed)
[facility] History and Physical   Primary Care Physician:  Jarold Motto, [place]   Reason for Procedure:  Colorectal cancer screening  Plan:    Screening colonoscopy with possible interventions as needed     HPI: Lindsey Avila is a very pleasant 45 y.o. female here for screening colonoscopy. Denies any nausea, vomiting, abdominal pain, melena or bright red blood per rectum  The risks and benefits as well as alternatives of endoscopic procedure(s) have been discussed and reviewed. All questions answered. The patient agrees to proceed.    Past Medical History:  Diagnosis Date   ADHD (attention deficit hyperactivity disorder) 05/2020   Allergy    Anemia    in remote past due to heavy periods   Anxiety    Chronic back pain    Depression 2009   prior with pregnancy   Encounter for routine gynecological examination    Dr. Waynard Reeds   GERD (gastroesophageal reflux disease)    IUD (intrauterine device) in place    IUD replaced 08/13/2018 with Mirena; Dr. Waynard Reeds   Obesity    Recurrent urinary tract infection    no prior urology consult   Seasonal allergies    Skin anomaly    keratinosis   Wears glasses     Past Surgical History:  Procedure Laterality Date   CESAREAN SECTION  2009   KNEE ARTHROSCOPY     left, arthroscopic    Prior to Admission medications   Medication Sig Start Date End Date Taking? Authorizing Provider  amphetamine-dextroamphetamine (ADDERALL) 20 MG tablet Take 1 tablet (20 mg total) by mouth daily. 08/14/21  Yes Worley, Lelon Mast, PA  Cyanocobalamin (VITAMIN B 12 PO) Take by mouth. One tablet daily.   Yes [provider]  escitalopram (LEXAPRO) 10 MG tablet Take 1 tablet (10 mg total) by mouth daily. 07/19/21  Yes Worley, Lelon Mast, PA  FIBER ADULT GUMMIES PO Take 2 each by mouth daily in the afternoon.   Yes [provider]  levonorgestrel (MIRENA, 52 MG,) 20 MCG/24HR IUD Mirena 20 mcg/24 hours (6 yrs) 52 mg  intrauterine device  Take 1 device by intrauterine route.   Yes [provider]  MAGNESIUM CITRATE PO Take 200 mg by mouth daily in the afternoon.   Yes [provider]  omega-3 acid ethyl esters (LOVAZA) 1 g capsule Take by mouth daily.   Yes [provider]  tirzepatide Greggory Keen) 5 MG/0.5ML Pen Inject 5 mg into the skin once a week. 08/14/21  Yes Bufford Buttner, Lelon Mast, PA  ALPRAZolam Prudy Feeler) 0.25 MG tablet alprazolam 0.25 mg tablet 06/07/20   [provider]  EPINEPHrine 0.3 mg/0.3 mL IJ SOAJ injection Inject 0.3 mg into the muscle as needed for anaphylaxis. Patient not taking: Reported on 08/23/2021 06/23/21   Jarold Motto, PA    Current Outpatient Medications  Medication Sig Dispense Refill   amphetamine-dextroamphetamine (ADDERALL) 20 MG tablet Take 1 tablet (20 mg total) by mouth daily. 30 tablet 0   Cyanocobalamin (VITAMIN B 12 PO) Take by mouth. One tablet daily.     escitalopram (LEXAPRO) 10 MG tablet Take 1 tablet (10 mg total) by mouth daily. 30 tablet 1   FIBER ADULT GUMMIES PO Take 2 each by mouth daily in the afternoon.     levonorgestrel (MIRENA, 52 MG,) 20 MCG/24HR IUD Mirena 20 mcg/24 hours (6 yrs) 52 mg intrauterine device  Take 1 device by intrauterine route.     MAGNESIUM CITRATE PO Take 200 mg by mouth daily in the  afternoon.     omega-3 acid ethyl esters (LOVAZA) 1 g capsule Take by mouth daily.     tirzepatide [facility]) 5 MG/0.5ML Pen Inject 5 mg into the skin once a week. 2 mL 0   ALPRAZolam (XANAX) 0.25 MG tablet alprazolam 0.25 mg tablet     EPINEPHrine 0.3 mg/0.3 mL IJ SOAJ injection Inject 0.3 mg into the muscle as needed for anaphylaxis. (Patient not taking: Reported on 08/23/2021) 2 each 0   Current Facility-Administered Medications  Medication Dose Route Frequency Provider Last Rate Last Admin   0.9 %  sodium chloride infusion  500 mL Intravenous Once Napoleon Form, MD        Allergies as of 08/23/2021 - Review  Complete 08/23/2021  Allergen Reaction Noted   Sulfa antibiotics Anaphylaxis and Hives 10/28/2014   Bupropion Hives 06/06/2020   Contrave [naltrexone-bupropion hcl er] Hives 09/30/2017   Latex  06/06/2020   Penicillins  10/28/2014    Family History  Problem Relation Age of Onset   Hypertension Mother    Hypothyroidism Mother    Obesity Mother    Macular degeneration Mother    Cataracts Mother    Cancer Mother        skin cancer   Hypertension Father    Cancer Father        basal cell skin   Benign prostatic hyperplasia Father    COPD Maternal Grandmother    Asthma Maternal Grandmother    Dementia Maternal Grandfather    Pancreatic cancer Paternal Grandmother    Cancer Paternal Grandmother        pancreatic   Heart disease Paternal Grandfather        MI   Hypertension Paternal Grandfather    Stroke Neg Hx    Diabetes Neg Hx    Colon cancer Neg Hx    Esophageal cancer Neg Hx    Rectal cancer Neg Hx    Stomach cancer Neg Hx     Social History   Socioeconomic History   Marital status: Divorced    Spouse name: Not on file   Number of children: Not on file   Years of education: Not on file   Highest education level: Not on file  Occupational History   Not on file  Tobacco Use   Smoking status: Never   Smokeless tobacco: Never  Vaping Use   Vaping Use: Never used  Substance and Sexual Activity   Alcohol use: Not Currently    Comment: rarely   Drug use: No   Sexual activity: Not Currently  Other Topics Concern   Not on file  Social History Narrative   Lives with 12yo son, cat and dog.     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  [employer].    Exercise - kickboxing, but prior  with treadmill, cardio, circuit training.   05/2020   Social Determinants of Health   Financial Resource Strain: Not on file  Food Insecurity: Not on file  Transportation Needs: Not on file  Physical Activity: Not on file  Stress: Not on file  Social  Connections: Not on file  Intimate Partner Violence: Not on file    Review of Systems:  All other review of systems negative except as mentioned in the HPI.  Physical Exam: Vital signs in last 24 hours: BP (!) 158/81 (BP Location: Right Arm, Patient Position: Sitting, Cuff Size: Normal)   Pulse 100   Temp 97.8 F (36.6 C) (Temporal)  Ht 5\' 6"  (1.676 m)   Wt 227 lb (103 kg)   SpO2 99%   BMI 36.64 kg/m  General:   Alert, NAD Lungs:  Clear .   Heart:  Regular rate and rhythm Abdomen:  Soft, nontender and nondistended. Neuro/Psych:  Alert and cooperative. Normal mood and affect. A and O x 3  Reviewed labs, radiology imaging, old records and pertinent past GI work up  Patient is appropriate for planned procedure(s) and anesthesia in an ambulatory setting   K. , MD [phone]

## 2021-08-23 NOTE — Patient Instructions (Signed)
Thank you for allowing us to care for you today! °Resume previous diet and medications today, return to normal daily activities tomorrow. °Recommend next screening colonoscopy in 10 years. ° ° ° °YOU HAD AN ENDOSCOPIC PROCEDURE TODAY AT THE [facility]:   Refer to the procedure report that was given to you for any specific questions about what was found during the examination.  If the procedure report does not answer your questions, please call your gastroenterologist to clarify.  If you requested that your care partner not be given the details of your procedure findings, then the procedure report has been included in a sealed envelope for you to review at your convenience later. ° °YOU SHOULD EXPECT: Some feelings of bloating in the abdomen. Passage of more gas than usual.  Walking can help get rid of the air that was put into your GI tract during the procedure and reduce the bloating. If you had a lower endoscopy (such as a colonoscopy or flexible sigmoidoscopy) you may notice spotting of blood in your stool or on the toilet paper. If you underwent a bowel prep for your procedure, you may not have a normal bowel movement for a few days. ° °Please Note:  You might notice some irritation and congestion in your nose or some drainage.  This is from the oxygen used during your procedure.  There is no need for concern and it should clear up in a day or so. ° °SYMPTOMS TO REPORT IMMEDIATELY: ° °Following lower endoscopy (colonoscopy or flexible sigmoidoscopy): ° Excessive amounts of blood in the stool ° Significant tenderness or worsening of abdominal pains ° Swelling of the abdomen that is new, acute ° Fever of 100°F or higher ° ° ° °For urgent or emergent issues, a gastroenterologist can be reached at any hour by calling [phone]. °Do not use MyChart messaging for urgent concerns.  ° ° °DIET:  We do recommend a small meal at first, but then you may proceed to your regular diet.  Drink plenty of  fluids but you should avoid alcoholic beverages for 24 hours. ° °ACTIVITY:  You should plan to take it easy for the rest of today and you should NOT DRIVE or use heavy machinery until tomorrow (because of the sedation medicines used during the test).   ° °FOLLOW UP: °Our staff will call the number listed on your records 48-72 hours following your procedure to check on you and address any questions or concerns that you may have regarding the information given to you following your procedure. If we do not reach you, we will leave a message.  We will attempt to reach you two times.  During this call, we will ask if you have developed any symptoms of COVID 19. If you develop any symptoms (ie: fever, flu-like symptoms, shortness of breath, cough etc.) before then, please call [phone].  If you test positive for Covid 19 in the 2 weeks post procedure, please call and report this information to us.   ° °If any biopsies were taken you will be contacted by phone or by letter within the next 1-3 weeks.  Please call us at [phone] if you have not heard about the biopsies in 3 weeks.  ° ° °SIGNATURES/CONFIDENTIALITY: °You and/or your care partner have signed paperwork which will be entered into your electronic medical record.  These signatures attest to the fact that that the information above on your After Visit Summary has been reviewed and is understood.  Full   responsibility of the confidentiality of this discharge information lies with you and/or your care-partner.  °

## 2021-08-23 NOTE — Op Note (Signed)
[facility] Patient Name: Lindsey Avila Procedure Date: 08/23/2021 12:34 PM MRN: [id number] Endoscopist: Napoleon Form , MD Age: 45 Referring MD:  Date of Birth: 1976/06/11 Gender: Female Account #: [id number] Procedure:                Colonoscopy Indications:              Screening for colorectal malignant neoplasm Medicines:                Monitored Anesthesia Care Procedure:                Pre-Anesthesia Assessment:                           - Prior to the procedure, a History and Physical                            was performed, and patient medications and                            allergies were reviewed. The patient's tolerance of                            previous anesthesia was also reviewed. The risks                            and benefits of the procedure and the sedation                            options and risks were discussed with the patient.                            All questions were answered, and informed consent                            was obtained. Prior Anticoagulants: The patient has                            taken no previous anticoagulant or antiplatelet                            agents. ASA Grade Assessment: II - A patient with                            mild systemic disease. After reviewing the risks                            and benefits, the patient was deemed in                            satisfactory condition to undergo the procedure.                           After obtaining informed consent, the colonoscope  was passed under direct vision. Throughout the                            procedure, the patient's blood pressure, pulse, and                            oxygen saturations were monitored continuously. The                            PCF-HQ190L Colonoscope was introduced through the                            anus and advanced to the the cecum, identified by                            appendiceal  orifice and ileocecal valve. The                            colonoscopy was performed without difficulty. The                            patient tolerated the procedure well. The quality                            of the bowel preparation was excellent. The                            ileocecal valve, appendiceal orifice, and rectum                            were photographed. Scope In: 1:25:52 PM Scope Out: 1:41:35 PM Scope Withdrawal Time: 0 hours 8 minutes 5 seconds  Total Procedure Duration: 0 hours 15 minutes 43 seconds  Findings:                 The perianal and digital rectal examinations were                            normal.                           Non-bleeding external and internal hemorrhoids were                            found during retroflexion. The hemorrhoids were                            small.                           The exam was otherwise without abnormality. Complications:            No immediate complications. Estimated Blood Loss:     Estimated blood loss was minimal. Impression:               - Non-bleeding external and internal hemorrhoids.                           -  The examination was otherwise normal.                           - No specimens collected. Recommendation:           - Patient has a contact number available for                            emergencies. The signs and symptoms of potential                            delayed complications were discussed with the                            patient. Return to normal activities tomorrow.                            Written discharge instructions were provided to the                            patient.                           - Resume previous diet.                           - Continue present medications.                           - Repeat colonoscopy in 10 years for screening                            purposes. Napoleon Form, MD 08/23/2021 1:47:01 PM This report has been signed  electronically.

## 2021-08-23 NOTE — Progress Notes (Signed)
A and O x3. Report to RN. Tolerated MAC anesthesia well. 

## 2021-08-24 ENCOUNTER — Ambulatory Visit: Payer: Self-pay | Admitting: Physician Assistant

## 2021-08-25 ENCOUNTER — Telehealth: Payer: Self-pay | Admitting: *Deleted

## 2021-08-25 NOTE — Telephone Encounter (Signed)
  Follow up Call-  Call back number 08/23/2021  Post procedure Call Back phone  # [phone]  Permission to leave phone message Yes  Some recent data might be hidden     Patient questions:  Do you have a fever, pain , or abdominal swelling? No. Pain Score  0 *  Have you tolerated food without any problems? Yes.    Have you been able to return to your normal activities? Yes.    Do you have any questions about your discharge instructions: Diet   No. Medications  No. Follow up visit  No.  Do you have questions or concerns about your Care? No.  Actions: * If pain score is 4 or above: No action needed, pain <4.  Have you developed a fever since your procedure? no  2.   Have you had an respiratory symptoms (SOB or cough) since your procedure? no  3.   Have you tested positive for COVID 19 since your procedure no  4.   Have you had any family members/close contacts diagnosed with the COVID 19 since your procedure?  no   If yes to any of these questions please route to Laverna Peace, RN and Karlton Lemon, RN

## 2021-09-04 ENCOUNTER — Telehealth: Payer: BC Managed Care – PPO | Admitting: Physician Assistant

## 2021-09-12 ENCOUNTER — Other Ambulatory Visit: Payer: Self-pay | Admitting: Physician Assistant

## 2021-09-13 ENCOUNTER — Other Ambulatory Visit: Payer: Self-pay | Admitting: Physician Assistant

## 2021-09-20 ENCOUNTER — Encounter: Payer: Self-pay | Admitting: Physician Assistant

## 2021-09-20 ENCOUNTER — Telehealth (INDEPENDENT_AMBULATORY_CARE_PROVIDER_SITE_OTHER): Payer: BC Managed Care – PPO | Admitting: Physician Assistant

## 2021-09-20 VITALS — Ht 66.0 in | Wt 218.0 lb

## 2021-09-20 DIAGNOSIS — F419 Anxiety disorder, unspecified: Secondary | ICD-10-CM | POA: Diagnosis not present

## 2021-09-20 DIAGNOSIS — F988 Other specified behavioral and emotional disorders with onset usually occurring in childhood and adolescence: Secondary | ICD-10-CM | POA: Diagnosis not present

## 2021-09-20 DIAGNOSIS — G4700 Insomnia, unspecified: Secondary | ICD-10-CM | POA: Insufficient documentation

## 2021-09-20 MED ORDER — AMPHETAMINE-DEXTROAMPHETAMINE 10 MG PO TABS: 10.0000 mg | ORAL_TABLET | Freq: Every day | ORAL | 0 refills | Status: DC

## 2021-09-20 MED ORDER — TIRZEPATIDE 7.5 MG/0.5ML ~~LOC~~ SOAJ: 7.5000 mg | SUBCUTANEOUS | 0 refills | Status: DC

## 2021-09-20 MED ORDER — AMPHETAMINE-DEXTROAMPHET ER 15 MG PO CP24: 15.0000 mg | ORAL_CAPSULE | ORAL | 0 refills | Status: DC

## 2021-09-20 NOTE — Progress Notes (Signed)
Virtual Visit via Video Note   I, Lindsey Avila, connected with  Lindsey Avila  ([id number], January 12, 1976) on 09/20/21 at  9:00 AM EST by a video-enabled telemedicine application and verified that I am speaking with the correct person using two identifiers.  Location: Patient: Home Provider: [facility] office   I discussed the limitations of evaluation and management by telemedicine and the availability of in person appointments. The patient expressed understanding and agreed to proceed.    History of Present Illness: Lindsey Avila is a 46 y.o. who identifies as a female who was assigned female at birth, and is being seen today for anxiety, obesity, ADHD.  Anxiety Stopped Lexapro because it was too much -- made her "catatonic." She felt like it slowed down her weight loss. She feels ok without it and wants to work on ADHD management at this time. Denies SI/HI.  Obesity Has lost 18 lb since starting Mounjaro. She is currently taking 5 mg Mounjaro weekly. She feels like this has helped with binge-ing thoughts. She is ready to increase dose. Denies any concerning side effects with this medication.  ADHD She is currently taking Adderall 20 mg daily. Notices that she is talking too much/interrupting people in the evening and still having issues procrastinating. She is interested in changing her regimen.  Problems:  Patient Active Problem List   Diagnosis Date Noted   Insomnia 09/20/2021   Attention deficit disorder 06/06/2020   Vitamin D deficiency 10/07/2018   Chronic pain of right knee 10/07/2018   Obesity with serious comorbidity 08/23/2017    Allergies:  Allergies  Allergen Reactions   Sulfa Antibiotics Anaphylaxis and Hives   Bupropion Hives   Contrave [Naltrexone-Bupropion Hcl Er] Hives   Latex    Penicillins     unknown   Medications:  Current Outpatient Medications:    ALPRAZolam (XANAX) 0.25 MG tablet, alprazolam 0.25 mg tablet, Disp: , Rfl:     amphetamine-dextroamphetamine (ADDERALL XR) 15 MG 24 hr capsule, Take 1 capsule by mouth every morning., Disp: 30 capsule, Rfl: 0   amphetamine-dextroamphetamine (ADDERALL) 10 MG tablet, Take 1 tablet (10 mg total) by mouth daily with breakfast., Disp: 30 tablet, Rfl: 0   Cyanocobalamin (VITAMIN B 12 PO), Take by mouth. One tablet daily., Disp: , Rfl:    EPINEPHrine 0.3 mg/0.3 mL IJ SOAJ injection, Inject 0.3 mg into the muscle as needed for anaphylaxis., Disp: 2 each, Rfl: 0   FIBER ADULT GUMMIES PO, Take 2 each by mouth daily in the afternoon., Disp: , Rfl:    levonorgestrel (MIRENA, 52 MG,) 20 MCG/24HR IUD, Mirena 20 mcg/24 hours (6 yrs) 52 mg intrauterine device  Take 1 device by intrauterine route., Disp: , Rfl:    MAGNESIUM CITRATE PO, Take 200 mg by mouth daily in the afternoon., Disp: , Rfl:    omega-3 acid ethyl esters (LOVAZA) 1 g capsule, Take by mouth daily., Disp: , Rfl:    tirzepatide (MOUNJARO) 7.5 MG/0.5ML Pen, Inject 7.5 mg into the skin once a week., Disp: 6 mL, Rfl: 0  Observations/Objective: Patient is well-developed, well-nourished in no acute distress.  Resting comfortably  at home.  Head is normocephalic, atraumatic.  No labored breathing.  Speech is clear and coherent with logical content.  Patient is alert and oriented at baseline.   Assessment and Plan: 1. Attention deficit disorder, unspecified hyperactivity presence Uncontrolled Long discussion regarding this Start Adderall 15 mg XR daily May add 10 mg Adderall in AM (trial 5 mg -  1/2 tablet) first and then next day may increase to 10 mg Send me mychart message in 2-3 weeks to check in  2. Anxiety Uncontrolled Denies SI/HI Declines specific medication at this time Follow-up in 3 months  3. Class 2 severe obesity with serious comorbidity in adult, unspecified BMI, unspecified obesity type (HCC) Improving Increase Mounjaro to 7.5 mg weekly Follow-up in 3 months, sooner if concerns  Follow Up  Instructions: I discussed the assessment and treatment plan with the patient. The patient was provided an opportunity to ask questions and all were answered. The patient agreed with the plan and demonstrated an understanding of the instructions.  A copy of instructions were sent to the patient via MyChart unless otherwise noted below.   The patient was advised to call back or seek an in-person evaluation if the symptoms worsen or if the condition fails to improve as anticipated.  Time spent with patient today was 35 minutes which consisted of chart review, discussing diagnosis, work up, treatment answering questions and documentation.   Lindsey Avila, [place]

## 2021-09-22 ENCOUNTER — Telehealth: Payer: Self-pay | Admitting: *Deleted

## 2021-09-22 NOTE — Telephone Encounter (Signed)
Key: [id number] - Rx #: [id number] Status Send today  Drug Mounjaro 7.5MG /0.5ML pen-injector Waiting for determination

## 2021-09-29 NOTE — Telephone Encounter (Signed)
Rx Estill Bakes on January 12 Your request has been denied

## 2021-10-24 ENCOUNTER — Encounter: Payer: Self-pay | Admitting: Physician Assistant

## 2021-10-31 ENCOUNTER — Ambulatory Visit: Payer: BC Managed Care – PPO | Admitting: Physician Assistant

## 2021-11-02 ENCOUNTER — Other Ambulatory Visit: Payer: Self-pay

## 2021-11-02 ENCOUNTER — Encounter: Payer: Self-pay | Admitting: Physician Assistant

## 2021-11-02 ENCOUNTER — Telehealth: Payer: Self-pay | Admitting: Physician Assistant

## 2021-11-02 ENCOUNTER — Ambulatory Visit: Payer: No Typology Code available for payment source | Admitting: Physician Assistant

## 2021-11-02 VITALS — BP 124/78 | HR 98 | Temp 97.7°F | Ht 66.0 in | Wt 214.2 lb

## 2021-11-02 DIAGNOSIS — F988 Other specified behavioral and emotional disorders with onset usually occurring in childhood and adolescence: Secondary | ICD-10-CM

## 2021-11-02 DIAGNOSIS — R5383 Other fatigue: Secondary | ICD-10-CM | POA: Diagnosis not present

## 2021-11-02 DIAGNOSIS — G4700 Insomnia, unspecified: Secondary | ICD-10-CM

## 2021-11-02 DIAGNOSIS — F419 Anxiety disorder, unspecified: Secondary | ICD-10-CM | POA: Diagnosis not present

## 2021-11-02 LAB — COMPREHENSIVE METABOLIC PANEL
ALT: 14 U/L (ref 0–35)
AST: 16 U/L (ref 0–37)
Albumin: 4.1 g/dL (ref 3.5–5.2)
Alkaline Phosphatase: 61 U/L (ref 39–117)
BUN: 13 mg/dL (ref 6–23)
CO2: 33 mEq/L — ABNORMAL HIGH (ref 19–32)
Calcium: 9.7 mg/dL (ref 8.4–10.5)
Chloride: 103 mEq/L (ref 96–112)
Creatinine, Ser: 0.8 mg/dL (ref 0.40–1.20)
GFR: 87.67 mL/min (ref >60.00)
Glucose, Bld: 80 mg/dL (ref 70–99)
Potassium: 4.2 mEq/L (ref 3.5–5.1)
Sodium: 137 mEq/L (ref 135–145)
Total Bilirubin: 0.7 mg/dL (ref 0.2–1.2)
Total Protein: 6.4 g/dL (ref 6.0–8.3)

## 2021-11-02 LAB — CBC WITH DIFFERENTIAL/PLATELET
Basophils Absolute: 0 10*3/uL (ref 0.0–0.1)
Basophils Relative: 0.6 % (ref 0.0–3.0)
Eosinophils Absolute: 0.2 10*3/uL (ref 0.0–0.7)
Eosinophils Relative: 2.6 % (ref 0.0–5.0)
HCT: 42.6 % (ref 36.0–46.0)
Hemoglobin: 14.4 g/dL (ref 12.0–15.0)
Lymphocytes Relative: 23.2 % (ref 12.0–46.0)
Lymphs Abs: 1.6 10*3/uL (ref 0.7–4.0)
MCHC: 33.8 g/dL (ref 30.0–36.0)
MCV: 84.1 fl (ref 78.0–100.0)
Monocytes Absolute: 0.4 10*3/uL (ref 0.1–1.0)
Monocytes Relative: 6.3 % (ref 3.0–12.0)
Neutro Abs: 4.7 10*3/uL (ref 1.4–7.7)
Neutrophils Relative %: 67.3 % (ref 43.0–77.0)
Platelets: 210 10*3/uL (ref 150.0–400.0)
RBC: 5.06 Mil/uL (ref 3.87–5.11)
RDW: 14 % (ref 11.5–15.5)
WBC: 7 10*3/uL (ref 4.0–10.5)

## 2021-11-02 LAB — T3, FREE: T3, Free: 3.1 pg/mL (ref 2.3–4.2)

## 2021-11-02 LAB — VITAMIN D 25 HYDROXY (VIT D DEFICIENCY, FRACTURES): VITD: 37.25 ng/mL (ref 30.00–100.00)

## 2021-11-02 LAB — IBC + FERRITIN
Ferritin: 115 ng/mL (ref 10.0–291.0)
Iron: 67 ug/dL (ref 42–145)
Saturation Ratios: 23 % (ref 20.0–50.0)
TIBC: 291.2 ug/dL (ref 250.0–450.0)
Transferrin: 208 mg/dL — ABNORMAL LOW (ref 212.0–360.0)

## 2021-11-02 LAB — VITAMIN B12: Vitamin B-12: 649 pg/mL (ref 211–911)

## 2021-11-02 LAB — T4, FREE: Free T4: 0.99 ng/dL (ref 0.60–1.60)

## 2021-11-02 LAB — TSH: TSH: 3.37 u[IU]/mL (ref 0.35–5.50)

## 2021-11-02 MED ORDER — TRAZODONE HCL 50 MG PO TABS: 25.0000 mg | ORAL_TABLET | Freq: Every evening | ORAL | 3 refills | Status: DC | PRN

## 2021-11-02 MED ORDER — ALPRAZOLAM 0.25 MG PO TABS: 0.2500 mg | ORAL_TABLET | Freq: Two times a day (BID) | ORAL | 1 refills | Status: DC | PRN

## 2021-11-02 MED ORDER — AMPHETAMINE-DEXTROAMPHETAMINE 10 MG PO TABS: ORAL_TABLET | ORAL | 0 refills | Status: DC

## 2021-11-02 MED ORDER — TIRZEPATIDE 10 MG/0.5ML ~~LOC~~ SOAJ: 10.0000 mg | SUBCUTANEOUS | 0 refills | Status: DC

## 2021-11-02 NOTE — Patient Instructions (Signed)
It was great to see you!  We will update blood work today  Dr. Marisue Brooklyn referral placed  Trazodone for sleep has been sent Mounjaro and Xanax refills sent  Sleep Hygiene  Do: (1) Go to bed at the same time each day. (2) Get up from bed at the same time each day. (3) Get regular exercise each day, preferably in the morning.  There is goof evidence that regular exercise improves restful sleep.  This includes stretching and aerobic exercise. (4) Get regular exposure to outdoor or bright lights, especially in the late afternoon. (5) Keep the temperature in your bedroom comfortable. (6) Keep the bedroom quiet when sleeping. (7) Keep the bedroom dark enough to facilitate sleep. (8) Use your bed only for sleep and sex. (9) Take medications as directed.  It is helpful to take prescribed sleeping pills 1 hour before bedtime, so they are causing drowsiness when you lie down, or 10 hours before getting up, to avoid daytime drowsiness. (10) Use a relaxation exercise just before going to sleep -- imagery, massage, warm bath. (11) Keep your feet and hands warm.  Wear warm socks and/or mittens or gloves to bed.  Don't: (1) Exercise just before going to bed. (2) Engage in stimulating activity just before bed, such as playing a competitive game, watching an exciting program on television, or having an important discussion with a loved one. (3) Have caffeine in the evening (coffee, teas, chocolate, sodas, etc.) (4) Read or watch television in bed. (5) Use alcohol to help you sleep. (6) Go to bed too hungry or too full. (7) Take another person's sleeping pills. (8) Take over-the-counter sleeping pills, without your doctor's knowledge.  Tolerance can develop rapidly with these medications.  Diphenhydramine can have serious side effects for elderly patients. (9) Take daytime naps. (10) Command yourself to go to sleep.  This only makes your mind and body more alert.  If you lie awake for more than  20-30 minutes, get up, go to a different room, participate in a quiet activity (Ex - non-excitable reading or television), and then return to bed when you feel sleepy.  Do this as many times during the night as needed.  This may cause you to have a night or two of poor sleep but it will train your brain to know when it is time for sleep.   Let's follow-up in 3 months, sooner if you have concerns.  If a referral was placed today, you will be contacted for an appointment. Please note that routine referrals can sometimes take up to 3-4 weeks to process. Please call our office if you haven't heard anything after this time frame.  Take care,  Jarold Motto PA-C

## 2021-11-02 NOTE — Telephone Encounter (Signed)
Pharmacy does not have adderall 10mg  only 20mg , pt wants to know if office can call in 20mg  instead since she really needs it.

## 2021-11-02 NOTE — Progress Notes (Signed)
Lindsey Avila is a 46 y.o. female here for a follow up of ADHD.   History of Present Illness:   Chief Complaint  Patient presents with   Medication follow up    Pt is fasting and requesting labs and wanting to discuss medication; concerned with being deficient in Vitamin D and wanting to check her levels so she would know which vitamins and supplements are needed; with also concerned with thyroid. Pt has concerns w/ her hormones as well.    HPI  ADHD During our prior video visit on 09/20/21, pt expressed that she didn't feel like her regimen of adderall 20 mg daily was effective. Stated she found she was still talking too much, interrupting people, and still dealing with procrastination. After further discussion, we decided to change her regimen to Adderall XR 15 mg daily. Informed patient that if needed she may add an additional Adderall 10 mg in the AM, adding 5 mg first then increasing to 10 mg the next morning.   Although pt has been compliant with taking Adderall XR 15 mg daily with a 5 mg add on in the AM, she has been experiencing increased fatigue, hard time focusing, and trouble staying motivated. In a MyChart message on 10/24/21, she stated that there was a day where she didn't take any medication and slept all day which caused her a bit of concern. At this time she is also concerned as to how much Adderall she is taking when it's not doing much for her.    Fatigue Since changing her ADHD regimen, pt has noticed that she is experiencing feelings of increased fatigue. States that she finds herself waking up at least once every night and although she has no trouble going back to sleep, she believes this to be a cause of overall fatigue. She does admit that she has never been a person with a lot of energy, but what she has been experiencing is much different from her baseline. Additionally she has noticed she uses her phone often at night, but has tried to decrease this over time.    Expresses she is not sure if this is depression, early menopause, or a significant organic issue that is occurring. Pt does have a hx of anemia due to heavy menstrual cycles but this is no longer an issue due to having an IUD. Pt has not taken any medication to help with this issue. Denies concerns for pregnancy or changes to routine.    Obesity  Yumiko has been compliant with taking mounjaro 7.5 mg weekly injection with no adverse effects. Despite this she would like to increase her dosage after noticing that she has hit a plateau with her weight loss. Denies any concerning sx.   Wt Readings from Last 4 Encounters:  11/02/21 214 lb 3.2 oz (97.2 kg)  09/20/21 218 lb (98.9 kg)  08/23/21 227 lb (103 kg)  07/31/21 227 lb (103 kg)    Anxiety; Insomnia Pt is compliant with taking xanax 0.25 mg as needed with no complications. States that although she is not taking this medication as often, she would like a refill to keep just in case. Denies SI/HI.    Past Medical History:  Diagnosis Date   ADHD (attention deficit hyperactivity disorder) 05/2020   Allergy    Anemia    in remote past due to heavy periods   Anxiety    Chronic back pain    Depression 2009   prior with pregnancy   Encounter for routine  gynecological examination    Dr. Waynard Reeds   GERD (gastroesophageal reflux disease)    IUD (intrauterine device) in place    IUD replaced 08/13/2018 with Mirena; Dr. Waynard Reeds   Obesity    Recurrent urinary tract infection    no prior urology consult   Seasonal allergies    Skin anomaly    keratinosis   Wears glasses      Social History   Tobacco Use   Smoking status: Never   Smokeless tobacco: Never  Vaping Use   Vaping Use: Never used  Substance Use Topics   Alcohol use: Not Currently    Comment: rarely   Drug use: No    Past Surgical History:  Procedure Laterality Date   CESAREAN SECTION  2009   KNEE ARTHROSCOPY     left, arthroscopic    Family History   Problem Relation Age of Onset   Hypertension Mother    Hypothyroidism Mother    Obesity Mother    Macular degeneration Mother    Cataracts Mother    Cancer Mother        skin cancer   Hypertension Father    Cancer Father        basal cell skin   Benign prostatic hyperplasia Father    COPD Maternal Grandmother    Asthma Maternal Grandmother    Dementia Maternal Grandfather    Pancreatic cancer Paternal Grandmother    Cancer Paternal Grandmother        pancreatic   Heart disease Paternal Grandfather        MI   Hypertension Paternal Grandfather    Stroke Neg Hx    Diabetes Neg Hx    Colon cancer Neg Hx    Esophageal cancer Neg Hx    Rectal cancer Neg Hx    Stomach cancer Neg Hx     Allergies  Allergen Reactions   Sulfa Antibiotics Anaphylaxis and Hives   Bupropion Hives   Contrave [Naltrexone-Bupropion Hcl Er] Hives   Latex    Penicillins     unknown    Current Medications:   Current Outpatient Medications:    Cyanocobalamin (VITAMIN B 12 PO), Take by mouth. One tablet daily., Disp: , Rfl:    EPINEPHrine 0.3 mg/0.3 mL IJ SOAJ injection, Inject 0.3 mg into the muscle as needed for anaphylaxis., Disp: 2 each, Rfl: 0   FIBER ADULT GUMMIES PO, Take 2 each by mouth daily in the afternoon., Disp: , Rfl:    levonorgestrel (MIRENA, 52 MG,) 20 MCG/24HR IUD, Mirena 20 mcg/24 hours (6 yrs) 52 mg intrauterine device  Take 1 device by intrauterine route., Disp: , Rfl:    MAGNESIUM CITRATE PO, Take 200 mg by mouth daily in the afternoon., Disp: , Rfl:    tirzepatide (MOUNJARO) 10 MG/0.5ML Pen, Inject 10 mg into the skin once a week., Disp: 6 mL, Rfl: 0   traZODone (DESYREL) 50 MG tablet, Take 0.5-1 tablets (25-50 mg total) by mouth at bedtime as needed for sleep., Disp: 30 tablet, Rfl: 3   ALPRAZolam (XANAX) 0.25 MG tablet, Take 1 tablet (0.25 mg total) by mouth 2 (two) times daily as needed for anxiety., Disp: 30 tablet, Rfl: 1   amphetamine-dextroamphetamine (ADDERALL) 10 MG  tablet, Take two tablets (20 mg total) in AM and one tablet (10 mg) at noon, Disp: 90 tablet, Rfl: 0   omega-3 acid ethyl esters (LOVAZA) 1 g capsule, Take by mouth daily. (Patient not taking: Reported on 11/02/2021), Disp: , Rfl:  Review of Systems:   ROS Negative unless otherwise specified per HPI. Vitals:   Vitals:   11/02/21 0900  BP: 124/78  Pulse: 98  Temp: 97.7 F (36.5 C)  TempSrc: Temporal  SpO2: 99%  Weight: 214 lb 3.2 oz (97.2 kg)  Height: 5\' 6"  (1.676 m)     Body mass index is 34.57 kg/m.  Physical Exam:   Physical Exam Vitals and nursing note reviewed.  Constitutional:      General: She is not in acute distress.    Appearance: She is well-developed. She is not ill-appearing or toxic-appearing.  Cardiovascular:     Rate and Rhythm: Normal rate and regular rhythm.     Pulses: Normal pulses.     Heart sounds: Normal heart sounds, S1 normal and S2 normal.  Pulmonary:     Effort: Pulmonary effort is normal.     Breath sounds: Normal breath sounds.  Skin:    General: Skin is warm and dry.  Neurological:     Mental Status: She is alert.     GCS: GCS eye subscore is 4. GCS verbal subscore is 5. GCS motor subscore is 6.  Psychiatric:        Speech: Speech normal.        Behavior: Behavior normal. Behavior is cooperative.    Assessment and Plan:   Attention deficit disorder, unspecified hyperactivity presence Uncontrolled  No red flags  Stop Adderall XR 15 mg daily  Restart Adderall 20 mg (two tablets) in AM, 10 mg in afternoon Referral to Dr. , [facility] for further evaluation  Follow up in 3 months, sooner if concerns   Fatigue, unspecified type No red flags  Will update labs today, will make recommendations accordingly  Concern for possible underlying depression -- she is agreeable to reviewing psychiatric diagnoses with formal evaluation with [facility] Continue to monitor Follow-up after  evaluation with psychiatry  Class 2 severe obesity with serious comorbidity in adult, unspecified BMI, unspecified obesity type (HCC) Improving  Increase Mounjaro to 10 mg weekly  Follow up in 3 months, sooner if concerns  Anxiety Controlled  Denies SI/HI Continue Xanax 0.25 mg as needed  Follow up as needed  Insomnia Uncontrolled Start trazodone 25-50 mg nightly as needed Provided patient with sleep hygiene recommendations    I,Havlyn C Ratchford,acting as a scribe for [place], PA.,have documented all relevant documentation on the behalf of [employer], PA,as directed by  Jarold Motto, PA while in the presence of Jarold Motto, Jarold Motto.  I, [place], Jarold Motto, have reviewed all documentation for this visit. The documentation on 11/02/21 for the exam, diagnosis, procedures, and orders are all accurate and complete.   11/04/21, PA-C

## 2021-11-03 ENCOUNTER — Telehealth: Payer: Self-pay | Admitting: Physician Assistant

## 2021-11-03 MED ORDER — AMPHETAMINE-DEXTROAMPHETAMINE 20 MG PO TABS: ORAL_TABLET | ORAL | 0 refills | Status: DC

## 2021-11-03 NOTE — Telephone Encounter (Signed)
Done, see other message

## 2021-11-03 NOTE — Telephone Encounter (Signed)
Please see message and advise 

## 2021-11-03 NOTE — Telephone Encounter (Signed)
Pt needs RX re-written for Adderal 20mg . Patient's pharmacy is out of her regular dosage. Pt stated that was going to change the dosage anyway.

## 2021-11-03 NOTE — Telephone Encounter (Signed)
Pt notified Rx was sent to the pharmacy as requested.

## 2021-12-11 ENCOUNTER — Other Ambulatory Visit: Payer: Self-pay | Admitting: Physician Assistant

## 2021-12-11 ENCOUNTER — Telehealth: Payer: Self-pay | Admitting: Physician Assistant

## 2021-12-11 MED ORDER — AMPHETAMINE-DEXTROAMPHETAMINE 20 MG PO TABS: ORAL_TABLET | ORAL | 0 refills | Status: DC

## 2021-12-11 MED ORDER — AMPHETAMINE-DEXTROAMPHETAMINE 10 MG PO TABS: ORAL_TABLET | ORAL | 0 refills | Status: DC

## 2021-12-11 NOTE — Telephone Encounter (Signed)
.. ?  Encourage patient to contact the pharmacy for refills or they can request refills through [facility] ? ?LAST APPOINTMENT DATE:  11/02/21 ? ?NEXT APPOINTMENT DATE: N/A ? ?MEDICATION:amphetamine-dextroamphetamine (ADDERALL) 10 MG tablet ? ?Is the patient out of medication? No ? ?PHARMACY: ?[id number] [place], [place] - [address] Phone:  [phone]  ?Fax:  [phone]  ?  ? ? ?Let patient know to contact pharmacy at the end of the day to make sure medication is ready. ? ?Please notify patient to allow 48-72 hours to process  ?

## 2021-12-11 NOTE — Telephone Encounter (Signed)
Please advise 

## 2021-12-11 NOTE — Telephone Encounter (Signed)
Pt is asking to have it called in soon. The pharmacy listed in the previous note has the medication listed but they don't know for how long.  ?

## 2021-12-11 NOTE — Telephone Encounter (Signed)
Patient state incorrect dosage was sent to an incorrect pharmacy.    Is requesting dosage asked for (90 qty of 10mg ) and requesting to go to [facility] [place] [facility].    States last script was changed due to current shortage in adderall.     Also wanted Sam to know that she can not get into [facility] until June 8th.    Please reach out to patient if she needs follow up visit on meds.    Please follow up with patient at [phone].

## 2022-01-04 ENCOUNTER — Telehealth: Payer: Self-pay

## 2022-01-04 ENCOUNTER — Other Ambulatory Visit: Payer: Self-pay

## 2022-01-04 MED ORDER — MOUNJARO 7.5 MG/0.5ML ~~LOC~~ SOAJ: SUBCUTANEOUS | 0 refills | Status: DC

## 2022-01-04 NOTE — Telephone Encounter (Signed)
Lindsey Avila ([name]) ?Rx #: [id number] ?Mounjaro 7.5MG /0.5ML pen-injectors ? ?Sent 01/04/22- waiting on determination ?

## 2022-01-04 NOTE — Telephone Encounter (Signed)
Advised receiving request for 90 day supply of Mounjaro 7.5 and sent to [facility] as requested. Pt verbalized understanding. ?

## 2022-01-09 NOTE — Telephone Encounter (Signed)
Prior Authorization done thru Covermymeds sent to plan awaiting response.  ? ?

## 2022-01-12 ENCOUNTER — Telehealth: Payer: Self-pay | Admitting: *Deleted

## 2022-01-12 NOTE — Telephone Encounter (Signed)
Spoke to pt told her received fax from [facility] saying PA for Mounjaro 7.5 mg has been cancelled and said to contact [employer] regarding Rx. Asked pt if she has 2 insurances? Pt said she has different plan for Rx's. Asked her if she has been able to pick up her medication? Pt said she got a text saying it is ready for pickup. Told pt okay if any trouble and need PA please let me know. Pt verbalized understanding. ?

## 2022-02-01 ENCOUNTER — Other Ambulatory Visit: Payer: Self-pay | Admitting: Physician Assistant

## 2022-03-05 ENCOUNTER — Encounter: Payer: Self-pay | Admitting: Physician Assistant

## 2022-03-05 ENCOUNTER — Other Ambulatory Visit: Payer: Self-pay | Admitting: *Deleted

## 2022-03-05 ENCOUNTER — Other Ambulatory Visit: Payer: Self-pay | Admitting: Physician Assistant

## 2022-03-05 MED ORDER — TIRZEPATIDE 10 MG/0.5ML ~~LOC~~ SOAJ: 10.0000 mg | SUBCUTANEOUS | 0 refills | Status: DC

## 2022-03-05 MED ORDER — TIRZEPATIDE 7.5 MG/0.5ML ~~LOC~~ SOAJ: 7.5000 mg | SUBCUTANEOUS | 0 refills | Status: DC

## 2022-03-05 NOTE — Telephone Encounter (Signed)
Prior Authorization for Mounjaro 7.5 mg done thru [facility]. I called and spoke to [facility]. Answered all questions to the best of my knowledge and faxed over office notes to [phone]. Reference # [id number]. Awaiting determination.

## 2022-03-07 NOTE — Telephone Encounter (Signed)
Received fax from [facility] saying PA was cancelled for [facility] due to not managed by [facility] need to contact [employer] at [phone].  Called [name] at [phone] entered pt's information and was told this plan does not require pre certification or notification.

## 2022-03-07 NOTE — Telephone Encounter (Signed)
Spoke to pt told her I received a fax from [facility] saying they cancelled PA for [facility] due to they do not manage it, had to call [employer]. I called [name] and it said does not require pre certification or notification for medication. Asked pt if she knew if the pharmacy had her correct insurance, you may want to check with them. Pt verbalized understanding and said she will contact pharmacy. If you have any problems please let me know. Pt verbalized understanding.

## 2022-03-27 ENCOUNTER — Other Ambulatory Visit: Payer: Self-pay | Admitting: *Deleted

## 2022-03-27 MED ORDER — TIRZEPATIDE 10 MG/0.5ML ~~LOC~~ SOAJ: 10.0000 mg | SUBCUTANEOUS | 0 refills | Status: DC

## 2022-04-05 ENCOUNTER — Other Ambulatory Visit: Payer: Self-pay | Admitting: Physician Assistant

## 2022-04-05 NOTE — Telephone Encounter (Signed)
Last seen in February, OK to refill?

## 2022-04-16 ENCOUNTER — Encounter: Payer: Self-pay | Admitting: Physician Assistant

## 2022-04-18 ENCOUNTER — Other Ambulatory Visit: Payer: Self-pay | Admitting: *Deleted

## 2022-04-18 MED ORDER — ALPRAZOLAM 0.5 MG PO TABS: ORAL_TABLET | ORAL | 0 refills | Status: DC

## 2022-04-18 NOTE — Telephone Encounter (Signed)
Pt requesting refill for Xanax 0.5 mg. Last OV 11/02/2021.

## 2022-04-18 NOTE — Telephone Encounter (Signed)
Called [facility] and spoke to [place], told her calling to do PA for Mounjaro 10 mg once a week, continuation of therapy. Answered all questions. Everlena Cooper said will hear determination up to 96 hours. Asked Everlena Cooper why the last two times I did PA is was cancelled saying they did not accept this and to call [employer]. Mica I see the cancellations here usually it has to come from provider or patient to start. Told her okay.  Spoke to told her called [facility] and did Pa for [facility] will have determination via fax up to 96 hours. Pt verbalized understanding.

## 2022-04-20 NOTE — Telephone Encounter (Signed)
Called pt told her received fax from [facility] stating PA for Lindsey Avila has been cancelled again due to [facility] does not manage requested drug and to contact [employer]. Told her this is the 3rd time. Pt verbalized understanding. Asked pt if she called [name]? Pt said no, because I got back to her yesterday so quick that you did the PA. Pt said she will contact insurance and let me know. Told her okay.

## 2022-04-20 NOTE — Telephone Encounter (Signed)
Received fax from [facility] PA for [employer] was cancelled again saying [facility] does not manage requested drug. Please contact [employer].

## 2022-04-25 ENCOUNTER — Encounter: Payer: Self-pay | Admitting: Physician Assistant

## 2022-04-26 NOTE — Telephone Encounter (Signed)
Spoke to pt told her I discussed with another provider about going through your medical plan for [facility] and he has never heard of that it always goes through pharmacy. Told her if you get me the information and phone number I will work on it next week when I am back in the office. Pt verbalized understanding and said she has the number at home but is out right now. Pt asked about other opitons? Told there is not much available at this time and due to shortage. Told her best to make an appt to discuss other options with [facility]. Pt verbalized understanding.

## 2022-05-17 LAB — BASIC METABOLIC PANEL
BUN: 10 (ref 4–21)
CO2: 25 — AB (ref 13–22)
Chloride: 101 (ref 99–108)
Creatinine: 1 (ref 0.5–1.1)
Glucose: 90
Potassium: 4.2 mEq/L (ref 3.5–5.1)
Sodium: 140 (ref 137–147)

## 2022-05-17 LAB — HEPATIC FUNCTION PANEL
ALT: 19 U/L (ref 7–35)
AST: 19 (ref 13–35)
Alkaline Phosphatase: 79 (ref 25–125)
Bilirubin, Total: 0.4

## 2022-05-17 LAB — HM MAMMOGRAPHY

## 2022-05-17 LAB — CBC: RBC: 4.89 (ref 3.87–5.11)

## 2022-05-18 LAB — COMPREHENSIVE METABOLIC PANEL
Albumin: 4.5 (ref 3.5–5.0)
Calcium: 9.4 (ref 8.7–10.7)
Globulin: 2.2
eGFR: 73

## 2022-05-18 LAB — TSH: TSH: 1.9 (ref 0.41–5.90)

## 2022-05-18 LAB — LIPID PANEL
Cholesterol: 162 (ref 0–200)
HDL: 48 (ref 35–70)
LDL Cholesterol: 99
Triglycerides: 77 (ref 40–160)

## 2022-05-18 LAB — FOLLICLE STIMULATING HORMONE: FSH: 7.1

## 2022-05-18 LAB — CBC AND DIFFERENTIAL
HCT: 41 (ref 36–46)
Hemoglobin: 13.8 (ref 12.0–16.0)
Platelets: 260 10*3/uL (ref 150–400)
WBC: 8.7

## 2022-05-18 LAB — VITAMIN D 25 HYDROXY (VIT D DEFICIENCY, FRACTURES): Vit D, 25-Hydroxy: 37.5

## 2022-05-18 LAB — T4, FREE: Free T4: 1.19

## 2022-05-18 LAB — HEMOGLOBIN A1C: Hemoglobin A1C: 4.8

## 2022-05-23 LAB — HM PAP SMEAR: HPV, high-risk: NEGATIVE

## 2022-06-06 ENCOUNTER — Encounter: Payer: Self-pay | Admitting: Physician Assistant

## 2022-06-08 ENCOUNTER — Other Ambulatory Visit: Payer: Self-pay | Admitting: Physician Assistant

## 2022-06-11 ENCOUNTER — Encounter: Payer: Self-pay | Admitting: *Deleted

## 2022-06-11 ENCOUNTER — Encounter: Payer: Self-pay | Admitting: Physician Assistant

## 2022-06-13 ENCOUNTER — Encounter: Payer: Self-pay | Admitting: Physician Assistant

## 2022-06-19 ENCOUNTER — Other Ambulatory Visit: Payer: Self-pay | Admitting: Physician Assistant

## 2022-06-19 NOTE — Telephone Encounter (Signed)
Patient must keep November appointment to receive further refills.

## 2022-06-19 NOTE — Telephone Encounter (Signed)
Last refill: 04/18/22 #30, 0 Last OV: 11/02/21 dx. ADD

## 2022-06-29 ENCOUNTER — Ambulatory Visit: Payer: No Typology Code available for payment source | Admitting: Physician Assistant

## 2022-06-29 ENCOUNTER — Encounter: Payer: Self-pay | Admitting: Physician Assistant

## 2022-06-29 ENCOUNTER — Telehealth: Payer: No Typology Code available for payment source | Admitting: Physician Assistant

## 2022-06-29 ENCOUNTER — Other Ambulatory Visit: Payer: Self-pay

## 2022-06-29 ENCOUNTER — Telehealth: Payer: Self-pay | Admitting: Physician Assistant

## 2022-06-29 VITALS — BP 112/80 | Temp 97.8°F | Ht 66.0 in | Wt 188.2 lb

## 2022-06-29 DIAGNOSIS — R519 Headache, unspecified: Secondary | ICD-10-CM | POA: Diagnosis not present

## 2022-06-29 DIAGNOSIS — T887XXA Unspecified adverse effect of drug or medicament, initial encounter: Secondary | ICD-10-CM | POA: Diagnosis not present

## 2022-06-29 DIAGNOSIS — R112 Nausea with vomiting, unspecified: Secondary | ICD-10-CM | POA: Diagnosis not present

## 2022-06-29 MED ORDER — PROMETHAZINE HCL 50 MG/ML IJ SOLN
25.0000 mg | Freq: Once | INTRAMUSCULAR | Status: AC
  Administered 2022-06-29: 25 mg via INTRAMUSCULAR

## 2022-06-29 MED ORDER — PROMETHAZINE HCL 12.5 MG PO TABS: 12.5000 mg | ORAL_TABLET | Freq: Three times a day (TID) | ORAL | 0 refills | Status: DC | PRN

## 2022-06-29 MED ORDER — KETOROLAC TROMETHAMINE 10 MG PO TABS: 10.0000 mg | ORAL_TABLET | Freq: Four times a day (QID) | ORAL | 0 refills | Status: DC | PRN

## 2022-06-29 MED ORDER — ONDANSETRON HCL 4 MG PO TABS: 4.0000 mg | ORAL_TABLET | Freq: Three times a day (TID) | ORAL | 0 refills | Status: DC | PRN

## 2022-06-29 MED ORDER — KETOROLAC TROMETHAMINE 60 MG/2ML IM SOLN
60.0000 mg | Freq: Once | INTRAMUSCULAR | Status: AC
  Administered 2022-06-29: 60 mg via INTRAMUSCULAR

## 2022-06-29 NOTE — Progress Notes (Addendum)
Lindsey Avila is a 46 y.o. female here for a new problem.  History of Present Illness:   Chief Complaint  Patient presents with   Headache    Started this morning with vomiting. She complains of sensitivity to light. She has taken Tylenol and Zofran.     HPI  Vomiting She is using her Mounjaro every 14-16 days to make it last longer.  She was taking 7.5mg  before increasing to 10mg  x3 days ago. She had issues with nausea and gagging with the injection previously. However, since increasing the dose she has had intermittent vomiting. Took Zofran at 10:00 today with relief. No vomiting since. Was able to eat normally yesterday.  Has accompanying constipation. She has small bowel movements irregularly. Using Fiber supplementation.  Headache She was taking Qelbree 300mg  for the last x1 month for ADHD. She had started on 200mg  initially. She has been off of the Howard for x4-5 days due to symptoms of headache and intermittent blurred vision which she suspects may have been due to the medication.  Woke up at 03:00 with a severe headache. She has experienced HA like this in the past. Denies worst HA of life. Has accompanying photophobia. Denies loss of vision.  Took a Goody powder and Xanax at that time.Her headache has improved throughout the day. She denies any slurred speech, focal weakness, double vision, or confusion.  Complains of mild nasal congestion. Her father has a viral illness and was seen in the ER yesterday but tested negative for COVID.  Denies any cough.  Denies dysuria or urinary frequency. Denies any diarrhea. Denies any hemoptysis.  Past Medical History:  Diagnosis Date   ADHD (attention deficit hyperactivity disorder) 05/2020   Allergy    Anemia    in remote past due to heavy periods   Anxiety    Chronic back pain    Depression 2009   prior with pregnancy   Encounter for routine gynecological examination    Dr. Vanessa Kick   GERD (gastroesophageal reflux  disease)    IUD (intrauterine device) in place    IUD replaced 08/13/2018 with Mirena; Dr. Vanessa Kick   Obesity    Recurrent urinary tract infection    no prior urology consult   Seasonal allergies    Skin anomaly    keratinosis   Wears glasses      Social History   Tobacco Use   Smoking status: Never   Smokeless tobacco: Never  Vaping Use   Vaping Use: Never used  Substance Use Topics   Alcohol use: Not Currently    Comment: rarely   Drug use: No    Past Surgical History:  Procedure Laterality Date   CESAREAN SECTION  2009   KNEE ARTHROSCOPY     left, arthroscopic    Family History  Problem Relation Age of Onset   Hypertension Mother    Hypothyroidism Mother    Obesity Mother    Macular degeneration Mother    Cataracts Mother    Cancer Mother        skin cancer   Hypertension Father    Cancer Father        basal cell skin   Benign prostatic hyperplasia Father    COPD Maternal Grandmother    Asthma Maternal Grandmother    Dementia Maternal Grandfather    Pancreatic cancer Paternal Grandmother    Cancer Paternal Grandmother        pancreatic   Heart disease Paternal Grandfather  MI   Hypertension Paternal Grandfather    Stroke Neg Hx    Diabetes Neg Hx    Colon cancer Neg Hx    Esophageal cancer Neg Hx    Rectal cancer Neg Hx    Stomach cancer Neg Hx     Allergies  Allergen Reactions   Sulfa Antibiotics Anaphylaxis and Hives   Bupropion Hives   Contrave [Naltrexone-Bupropion Hcl Er] Hives   Latex    Penicillins     unknown    Current Medications:   Current Outpatient Medications:    ALPRAZolam (XANAX) 0.5 MG tablet, TAKE 1/2 TABLET BY MOUTH TWO TIMES A DAY AS NEEDED FOR ANXIETY, Disp: 30 tablet, Rfl: 0   Cyanocobalamin (VITAMIN B 12 PO), Take by mouth. One tablet daily., Disp: , Rfl:    EPINEPHrine 0.3 mg/0.3 mL IJ SOAJ injection, Inject 0.3 mg into the muscle as needed for anaphylaxis., Disp: 2 each, Rfl: 0   FIBER ADULT GUMMIES PO,  Take 2 each by mouth daily in the afternoon., Disp: , Rfl:    levonorgestrel (MIRENA, 52 MG,) 20 MCG/24HR IUD, Mirena 20 mcg/24 hours (6 yrs) 52 mg intrauterine device  Take 1 device by intrauterine route., Disp: , Rfl:    MAGNESIUM CITRATE PO, Take 200 mg by mouth daily in the afternoon., Disp: , Rfl:    MOUNJARO 10 MG/0.5ML Pen, INJECT 10 MG UNDER THE SKIN ONCE PER WEEK, Disp: 2 mL, Rfl: 0   omega-3 acid ethyl esters (LOVAZA) 1 g capsule, Take by mouth daily., Disp: , Rfl:    ondansetron (ZOFRAN) 4 MG tablet, Take 1 tablet (4 mg total) by mouth every 8 (eight) hours as needed for nausea or vomiting., Disp: 20 tablet, Rfl: 0   traZODone (DESYREL) 50 MG tablet, TAKE 1/2 TO 1 TABLET BY MOUTH AT BEDTIME AS NEEDED FOR SLEEP, Disp: 30 tablet, Rfl: 3   amphetamine-dextroamphetamine (ADDERALL) 10 MG tablet, Take two tablets in AM and one tablet at noon., Disp: 90 tablet, Rfl: 0   Review of Systems:   Review of Systems  Constitutional:  Negative for chills, fever, malaise/fatigue and weight loss.  HENT:  Positive for congestion. Negative for hearing loss, sinus pain and sore throat.   Eyes:  Positive for blurred vision (intermittent) and photophobia. Negative for double vision.  Respiratory:  Negative for cough and hemoptysis.   Cardiovascular:  Negative for chest pain, palpitations, leg swelling and PND.  Gastrointestinal:  Positive for constipation, nausea and vomiting. Negative for abdominal pain, diarrhea and heartburn.  Genitourinary:  Negative for dysuria, frequency and urgency.  Musculoskeletal:  Negative for back pain, myalgias and neck pain.  Skin:  Negative for itching and rash.  Neurological:  Positive for headaches. Negative for dizziness, tingling and seizures.  Endo/Heme/Allergies:  Negative for polydipsia.  Psychiatric/Behavioral:  Negative for depression. The patient is not nervous/anxious.     Vitals:   Vitals:   06/29/22 1541  Temp: 97.8 F (36.6 C)  TempSrc: Temporal   Weight: 188 lb 3.2 oz (85.4 kg)  Height: 5\' 6"  (1.676 m)     Body mass index is 30.38 kg/m.  Physical Exam:   Physical Exam Vitals and nursing note reviewed.  Constitutional:      General: She is not in acute distress.    Appearance: She is well-developed. She is not ill-appearing or toxic-appearing.  Cardiovascular:     Rate and Rhythm: Normal rate and regular rhythm.     Pulses: Normal pulses.     Heart  sounds: Normal heart sounds, S1 normal and S2 normal.  Pulmonary:     Effort: Pulmonary effort is normal.     Breath sounds: Normal breath sounds.  Skin:    General: Skin is warm and dry.  Neurological:     General: No focal deficit present.     Mental Status: She is alert.     GCS: GCS eye subscore is 4. GCS verbal subscore is 5. GCS motor subscore is 6.     Cranial Nerves: Cranial nerves 2-12 are intact.     Sensory: Sensation is intact.     Motor: Motor function is intact.     Coordination: Coordination is intact.     Gait: Gait is intact.  Psychiatric:        Speech: Speech normal.        Behavior: Behavior normal. Behavior is cooperative.     Assessment and Plan:   Medication side effect Suspect sx are related to increase of Mounjaro and recent stopping of Qelbree  Recommend continue holding of Mounjaro until completely asymptomatic Recommend follow-up with prescriber regarding Vaughan Basta -- she has asked [place] to take this over but I'm requesting records on this prior to take over (forms signed today)  Nausea and vomiting, unspecified vomiting type Suspect related to [facility] vs viral illness Phenergan injection provided today Oral phenergan to start tomorrow if needed  Acute nonintractable headache, unspecified headache type No red flags Neuro exam is benign BP is WNL Denies this as worst HA of life, thunderclap onset -- suspect this is related to medication withdrawal from SNRI Toradol and Phenergan injection provided May take toradol tomorrow if  needed Worsening precautions printed on AVS and discussed with patient and driver If any change of symptoms or worsening symptoms, discussed ER evaluation ASAP  Jarold Motto, PA-C   I,Alexis Herring,acting as a Neurosurgeon for [employer], PA.,have documented all relevant documentation on the behalf of Jarold Motto, PA,as directed by  Jarold Motto, PA while in the presence of Jarold Motto, [place].  I, Jarold Motto, [place], have reviewed all documentation for this visit. The documentation on 06/29/22 for the exam, diagnosis, procedures, and orders are all accurate and complete.

## 2022-06-29 NOTE — Progress Notes (Signed)
E-Visit for Nausea and Vomiting   We are sorry that you are not feeling well. Here is how we plan to help!  Based on what you have shared with me it looks like you have a Virus that is irritating your GI tract.  Vomiting is the forceful emptying of a portion of the stomach's content through the mouth.  Although nausea and vomiting can make you feel miserable, it's important to remember that these are not diseases, but rather symptoms of an underlying illness.  When we treat short term symptoms, we always caution that any symptoms that persist should be fully evaluated in a medical office.  I have prescribed a medication that will help alleviate your symptoms and allow you to stay hydrated:  Zofran 4 mg 1 tablet every 8 hours as needed for nausea and vomiting  HOME CARE: Drink clear liquids.  This is very important! Dehydration (the lack of fluid) can lead to a serious complication.  Start off with 1 tablespoon every 5 minutes for 8 hours. You may begin eating bland foods after 8 hours without vomiting.  Start with saltine crackers, white bread, rice, mashed potatoes, applesauce. After 48 hours on a bland diet, you may resume a normal diet. Try to go to sleep.  Sleep often empties the stomach and relieves the need to vomit.  GET HELP RIGHT AWAY IF:  Your symptoms do not improve or worsen within 2 days after treatment. You have a fever for over 3 days. You cannot keep down fluids after trying the medication.  MAKE SURE YOU:  Understand these instructions. Will watch your condition. Will get help right away if you are not doing well or get worse.    Thank you for choosing an e-visit.  Your e-visit answers were reviewed by a board certified advanced clinical practitioner to complete your personal care plan. Depending upon the condition, your plan could have included both over the counter or prescription medications.  Please review your pharmacy choice. Make sure the pharmacy is open so  you can pick up prescription now. If there is a problem, you may contact your provider through MyChart messaging and have the prescription routed to another pharmacy.  Your safety is important to us. If you have drug allergies check your prescription carefully.   For the next 24 hours you can use MyChart to ask questions about today's visit, request a non-urgent call back, or ask for a work or school excuse. You will get an email in the next two days asking about your experience. I hope that your e-visit has been valuable and will speed your recovery.  I have spent 5 minutes in review of e-visit questionnaire, review and updating patient chart, medical decision making and response to patient.   Jensine Luz M Reneshia Zuccaro, PA-C  

## 2022-06-29 NOTE — Patient Instructions (Signed)
It was great to see you!  I will await records from [facility] to take over your regimen  You received Toradol and Phenergan injections today  You can start these orally (I have sent them in) tomorrow  Headache Precautions: Contact a doctor if: Your symptoms are not helped by medicine. You have a headache that feels different than the other headaches. You feel sick to your stomach (nauseous) or you throw up (vomit). You have a fever. Get help right away if: Your headache gets very bad quickly. Your headache gets worse after a lot of physical activity. You keep throwing up. You have a stiff neck. You have trouble seeing. You have trouble speaking. You have pain in the eye or ear. Your muscles are weak or you lose muscle control. You lose your balance or have trouble walking. You feel like you will pass out (faint) or you pass out. You are mixed up (confused). You have a seizure.

## 2022-06-29 NOTE — Telephone Encounter (Signed)
Patient seen in office for HA, n/v on Friday.  Please call and see how patient is doing on Monday?  Lindsey Avila

## 2022-07-02 ENCOUNTER — Telehealth: Payer: No Typology Code available for payment source | Admitting: Physician Assistant

## 2022-07-02 NOTE — Telephone Encounter (Signed)
Spoke to pt asked her how she was feeling? Pt said she is feeling much better. Told her okay good. Pt said we didn't get to discuss much about her ADD medication and she was going to get my records, wanted to know what the follow up is? Told pt once she gets your records and reviews them we will call you to schedule an appt to discuss medication. Pt verbalized understanding.  Samantha notified pt is feeling better.

## 2022-07-16 ENCOUNTER — Other Ambulatory Visit: Payer: Self-pay | Admitting: Physician Assistant

## 2022-07-23 ENCOUNTER — Ambulatory Visit (INDEPENDENT_AMBULATORY_CARE_PROVIDER_SITE_OTHER): Payer: No Typology Code available for payment source | Admitting: Physician Assistant

## 2022-07-23 ENCOUNTER — Encounter: Payer: Self-pay | Admitting: Physician Assistant

## 2022-07-23 VITALS — BP 114/70 | HR 87 | Temp 97.3°F | Ht 66.0 in | Wt 194.4 lb

## 2022-07-23 DIAGNOSIS — F988 Other specified behavioral and emotional disorders with onset usually occurring in childhood and adolescence: Secondary | ICD-10-CM

## 2022-07-23 DIAGNOSIS — F419 Anxiety disorder, unspecified: Secondary | ICD-10-CM | POA: Diagnosis not present

## 2022-07-23 DIAGNOSIS — Z0000 Encounter for general adult medical examination without abnormal findings: Secondary | ICD-10-CM

## 2022-07-23 DIAGNOSIS — G4700 Insomnia, unspecified: Secondary | ICD-10-CM

## 2022-07-23 DIAGNOSIS — E785 Hyperlipidemia, unspecified: Secondary | ICD-10-CM

## 2022-07-23 MED ORDER — LISDEXAMFETAMINE DIMESYLATE 20 MG PO CAPS: 20.0000 mg | ORAL_CAPSULE | Freq: Every day | ORAL | 0 refills | Status: DC

## 2022-07-23 NOTE — Patient Instructions (Addendum)
It was great to see you!  Start Vyvanse 20 mg -- if it is unaffordable, we will send in Adderall XR  Please start taking trazodone  Take care,  Lindsey Avila

## 2022-07-23 NOTE — Progress Notes (Signed)
Subjective:    Lindsey Avila is a 46 y.o. female and is here for a comprehensive physical exam.  HPI  Health Maintenance Due  Topic Date Due   COVID-19 Vaccine (4 - Janssen risk series) 08/04/2021   INFLUENZA VACCINE  04/17/2022    Acute Concerns: None  Chronic Issues: Anxiety Pt has been stressed due to her father being in the hospital She states she has been using Trazodone at bedtime sporadically.  Has prn Xanax.  ADHD She was seeing [facility] and only having partial success of her symptoms with this, would like to transfer care back to [place] due to cost Would like to resume Vyvanse 20mg . Has difficulty focusing, especially with the stress with her father.  Obesity Has concerns about her weight gain since stopping Mounjaro. Wt Readings from Last 3 Encounters:  06/29/22 188 lb 3.2 oz (85.4 kg)  11/02/21 214 lb 3.2 oz (97.2 kg)  09/20/21 218 lb (98.9 kg)    Health Maintenance: Immunizations -- UTD Colonoscopy -- 08/23/2021: Non-bleeding external and internal hemorrhoids were found during retroflexion. Otherwise normal. Mammogram -- 05/17/2022: No evidence of malignancy. PAP -- UTD Bone Density -- N/A Diet -- Well balanced Exercise -- Has an exercise routine- has not been exercising x2 weeks with her father in the hospital  Sleep habits -- Poor sleep Mood -- Okay  UTD with dentist? - Yes UTD with eye doctor? - Yes  Weight history: Wt Readings from Last 10 Encounters:  06/29/22 188 lb 3.2 oz (85.4 kg)  11/02/21 214 lb 3.2 oz (97.2 kg)  09/20/21 218 lb (98.9 kg)  08/23/21 227 lb (103 kg)  07/31/21 227 lb (103 kg)  07/19/21 228 lb 6.1 oz (103.6 kg)  06/23/21 237 lb 4 oz (107.6 kg)  06/06/20 243 lb (110.2 kg)  10/07/18 224 lb 3.2 oz (101.7 kg)  09/30/17 234 lb 3.2 oz (106.2 kg)   There is no height or weight on file to calculate BMI. No LMP recorded. (Menstrual status: IUD).  Alcohol use:  reports that she does not currently use  alcohol.  Tobacco use:  Tobacco Use: Low Risk  (06/29/2022)   Patient History    Smoking Tobacco Use: Never    Smokeless Tobacco Use: Never    Passive Exposure: Not on file   Eligible for lung cancer screening? no     09/20/2021    8:55 AM  Depression screen PHQ 2/9  Decreased Interest 0  Down, Depressed, Hopeless 0  PHQ - 2 Score 0     Other providers/specialists: Patient Care Team: Inda Coke, [place] as PCP - General (Physician Assistant)    PMHx, SurgHx, SocialHx, Medications, and Allergies were reviewed in the Visit Navigator and updated as appropriate.   Past Medical History:  Diagnosis Date   ADHD (attention deficit hyperactivity disorder) 05/2020   Allergy    Anemia    in remote past due to heavy periods   Anxiety    Chronic back pain    Depression 2009   prior with pregnancy   Encounter for routine gynecological examination    Dr. Vanessa Kick   GERD (gastroesophageal reflux disease)    IUD (intrauterine device) in place    IUD replaced 08/13/2018 with Mirena; Dr. Vanessa Kick   Obesity    Recurrent urinary tract infection    no prior urology consult   Seasonal allergies    Skin anomaly    keratinosis   Wears glasses      Past Surgical  History:  Procedure Laterality Date   CESAREAN SECTION  2009   KNEE ARTHROSCOPY     left, arthroscopic     Family History  Problem Relation Age of Onset   Hypertension Mother    Hypothyroidism Mother    Obesity Mother    Macular degeneration Mother    Cataracts Mother    Cancer Mother        skin cancer   Hypertension Father    Cancer Father        basal cell skin   Benign prostatic hyperplasia Father    COPD Maternal Grandmother    Asthma Maternal Grandmother    Dementia Maternal Grandfather    Pancreatic cancer Paternal Grandmother    Cancer Paternal Grandmother        pancreatic   Heart disease Paternal Grandfather        MI   Hypertension Paternal Grandfather    Stroke Neg Hx    Diabetes Neg  Hx    Colon cancer Neg Hx    Esophageal cancer Neg Hx    Rectal cancer Neg Hx    Stomach cancer Neg Hx     Social History   Tobacco Use   Smoking status: Never   Smokeless tobacco: Never  Vaping Use   Vaping Use: Never used  Substance Use Topics   Alcohol use: Not Currently    Comment: rarely   Drug use: No    Review of Systems:   Review of Systems  Constitutional:  Negative for chills, fever, malaise/fatigue and weight loss.  HENT:  Negative for hearing loss, sinus pain and sore throat.   Eyes:  Negative for blurred vision.  Respiratory:  Negative for cough and hemoptysis.   Cardiovascular:  Negative for chest pain, palpitations, leg swelling and PND.  Gastrointestinal:  Negative for abdominal pain, constipation, diarrhea, heartburn, nausea and vomiting.  Genitourinary:  Negative for dysuria, frequency and urgency.  Musculoskeletal:  Negative for back pain, myalgias and neck pain.  Skin:  Negative for itching and rash.  Neurological:  Negative for dizziness, tingling, seizures and headaches.  Endo/Heme/Allergies:  Negative for polydipsia.  Psychiatric/Behavioral:  Negative for depression. The patient is nervous/anxious and has insomnia.        + Stress    Objective:   There were no vitals taken for this visit. There is no height or weight on file to calculate BMI.   General Appearance:    Alert, cooperative, no distress, appears stated age  Head:    Normocephalic, without obvious abnormality, atraumatic  Eyes:    PERRL, conjunctiva/corneas clear, EOM's intact, fundi    benign, both eyes  Ears:    Normal TM's and external ear canals, both ears  Nose:   Nares normal, septum midline, mucosa normal, no drainage    or sinus tenderness  Throat:   Lips, mucosa, and tongue normal; teeth and gums normal  Neck:   Supple, symmetrical, trachea midline, no adenopathy;    thyroid:  no enlargement/tenderness/nodules; no carotid   bruit or JVD  Back:     Symmetric, no  curvature, ROM normal, no CVA tenderness  Lungs:     Clear to auscultation bilaterally, respirations unlabored  Chest Wall:    No tenderness or deformity   Heart:    Regular rate and rhythm, S1 and S2 normal, no murmur, rub or gallop  Breast Exam:    Deferred  Abdomen:     Soft, non-tender, bowel sounds active all four quadrants,  no masses, no organomegaly  Genitalia:    Deferred  Extremities:   Extremities normal, atraumatic, no cyanosis or edema  Pulses:   2+ and symmetric all extremities  Skin:   Skin color, texture, turgor normal, no rashes or lesions  Lymph nodes:   Cervical, supraclavicular, and axillary nodes normal  Neurologic:   CNII-XII intact, normal strength, sensation and reflexes    throughout    Assessment/Plan:   Routine physical examination Today patient counseled on age appropriate routine health concerns for screening and prevention, each reviewed and up to date or declined. Immunizations reviewed and up to date or declined. Labs reviewed. Risk factors for depression reviewed and negative. Hearing function and visual acuity are intact. ADLs screened and addressed as needed. Functional ability and level of safety reviewed and appropriate. Education, counseling and referrals performed based on assessed risks today. Patient provided with a copy of personalized plan for preventive services.  Attention deficit disorder, unspecified hyperactivity presence Uncontrolled Trial vyvanse 20 mg If unavailable/unaffordable, she would like to trial Adderall XR  Anxiety Uncontrolled Will work on getting ADHD better managed and reassess  Insomnia, unspecified type Uncontrolled Will work on getting ADHD better managed and reassess  Encouraged use of trazodone  Hyperlipidemia, unspecified hyperlipidemia type Reviewed prior labs Continue to monitor Does not meet criteria for statin at this time  I,Alexis Herring,acting as a Neurosurgeon for [employer], PA.,have documented all  relevant documentation on the behalf of Jarold Motto, PA,as directed by  Jarold Motto, PA while in the presence of Jarold Motto, [place].  I, Jarold Motto, [place], have reviewed all documentation for this visit. The documentation on 07/23/22 for the exam, diagnosis, procedures, and orders are all accurate and complete.  Jarold Motto PA-C

## 2022-08-11 ENCOUNTER — Encounter: Payer: Self-pay | Admitting: Physician Assistant

## 2022-08-17 ENCOUNTER — Other Ambulatory Visit: Payer: Self-pay | Admitting: Physician Assistant

## 2022-08-17 MED ORDER — RYBELSUS 3 MG PO TABS: 3.0000 mg | ORAL_TABLET | Freq: Every day | ORAL | 0 refills | Status: DC

## 2022-08-17 NOTE — Telephone Encounter (Signed)
Please see patient response and advise 

## 2022-08-20 ENCOUNTER — Telehealth: Payer: Self-pay | Admitting: *Deleted

## 2022-08-20 NOTE — Telephone Encounter (Signed)
Spoke to pt told her received fax from pharmacy need PA for Rybelsus. Lindsey Avila said to let you know it is not covered. Pt said can you please try PA and see what happens? Told pt okay I will try and let you know. Pt verbalized understanding.

## 2022-08-29 ENCOUNTER — Other Ambulatory Visit: Payer: Self-pay | Admitting: Physician Assistant

## 2022-08-29 NOTE — Telephone Encounter (Signed)
Prior Auth for Rybelsus 3 mg done thru Covermymeds. Awaiting response. KEYInocencio Homes

## 2022-08-30 NOTE — Telephone Encounter (Signed)
Last refill: 07/17/22 #30, 0 Last OV: 07/23/22 dx. CPE

## 2022-09-03 NOTE — Telephone Encounter (Signed)
Samantha, PA for Rybelsus is denied. Pt's plan does not cover any weight loss medications. Please advise.

## 2022-09-03 NOTE — Telephone Encounter (Signed)
Received fax from pharmacy PA needed for Rybelsus, PA done thru Covermymeds and received fax from [employer] saying request was cancelled need to contact [employer].  Called [name] at [phone] and spoke to [place], told her calling to do PA for Rybelsus gave patients info. Misty Stanley said plan does not cover any weight loss medications. Told her okay thank you.

## 2022-09-03 NOTE — Telephone Encounter (Signed)
PA for Rybelsus was denied. See My Chart message.

## 2022-10-07 ENCOUNTER — Encounter: Payer: Self-pay | Admitting: Physician Assistant

## 2022-10-08 ENCOUNTER — Other Ambulatory Visit: Payer: Self-pay | Admitting: Physician Assistant

## 2022-10-08 ENCOUNTER — Encounter: Payer: Self-pay | Admitting: Physician Assistant

## 2022-10-08 MED ORDER — LISDEXAMFETAMINE DIMESYLATE 40 MG PO CAPS: 40.0000 mg | ORAL_CAPSULE | Freq: Every day | ORAL | 0 refills | Status: DC

## 2022-10-08 MED ORDER — TIRZEPATIDE-WEIGHT MANAGEMENT 5 MG/0.5ML ~~LOC~~ SOAJ: 5.0000 mg | SUBCUTANEOUS | 1 refills | Status: DC

## 2022-10-11 ENCOUNTER — Encounter: Payer: Self-pay | Admitting: Physician Assistant

## 2022-10-12 ENCOUNTER — Other Ambulatory Visit: Payer: Self-pay | Admitting: Family Medicine

## 2022-10-12 MED ORDER — LISDEXAMFETAMINE DIMESYLATE 50 MG PO CHEW: 50.0000 mg | CHEWABLE_TABLET | Freq: Every day | ORAL | 0 refills | Status: DC

## 2022-10-12 NOTE — Telephone Encounter (Signed)
There is a Producer, television/film/video for Vyvanse and pt found one place that has them but for 50mg  at [place], can you send the RX at the [facility] [facility]? Please advise.

## 2022-10-12 NOTE — Telephone Encounter (Signed)
Please see pt response and advise 

## 2022-10-19 ENCOUNTER — Telehealth: Payer: Self-pay | Admitting: *Deleted

## 2022-10-19 ENCOUNTER — Other Ambulatory Visit: Payer: Self-pay | Admitting: Physician Assistant

## 2022-10-19 ENCOUNTER — Telehealth: Payer: Self-pay | Admitting: Physician Assistant

## 2022-10-19 MED ORDER — LISDEXAMFETAMINE DIMESYLATE 50 MG PO CAPS: 50.0000 mg | ORAL_CAPSULE | Freq: Every day | ORAL | 0 refills | Status: DC

## 2022-10-19 MED ORDER — LISDEXAMFETAMINE DIMESYLATE 50 MG PO CHEW: 50.0000 mg | CHEWABLE_TABLET | Freq: Every day | ORAL | 0 refills | Status: DC

## 2022-10-19 NOTE — Telephone Encounter (Signed)
Pt notified Rx for Vyvanse sent to pharmacy as requested.

## 2022-10-19 NOTE — Telephone Encounter (Signed)
Please see message about Vyvanse.

## 2022-10-19 NOTE — Telephone Encounter (Signed)
Vyvanse has been out all the pharmacy but pt found [facility], [address] [phone]  It can be chewable as well if you can send.  Please advise

## 2022-10-19 NOTE — Telephone Encounter (Signed)
PA needed for Zepbound 5 mg. PA done thru Covermymeds, records also faxed to [phone]. Awaiting determination KEY: [id number]

## 2022-10-19 NOTE — Telephone Encounter (Signed)
Patient states: - Pharmacy informed her that they do not have chewable tablets, only capsules   Patient requests capsules for vyvvanse be sent into the pharmacy.

## 2022-10-19 NOTE — Telephone Encounter (Signed)
Please see message. °

## 2022-10-22 ENCOUNTER — Other Ambulatory Visit: Payer: Self-pay | Admitting: Physician Assistant

## 2022-10-22 MED ORDER — TIRZEPATIDE 5 MG/0.5ML ~~LOC~~ SOAJ: 5.0000 mg | SUBCUTANEOUS | 0 refills | Status: DC

## 2022-10-22 MED ORDER — AMPHETAMINE-DEXTROAMPHET ER 20 MG PO CP24: 20.0000 mg | ORAL_CAPSULE | ORAL | 0 refills | Status: DC

## 2022-10-22 NOTE — Telephone Encounter (Signed)
Left detailed message on personal voicemail, I heard back from insurance PA denied for Zepbound the requested medication and/or diagnosis are not a covered benefit and excluded from coverage in accordance with the terms and conditions of you plan benefit.  Any questions please contact the office.

## 2022-10-22 NOTE — Telephone Encounter (Signed)
See other message regarding Adderall. Pt aware Rx for Mounjaro 5 mg sent to pharmacy as requested.

## 2022-10-22 NOTE — Telephone Encounter (Signed)
Received response from insurance [employer] [place] denied for Zepbound the requested medication and/or diagnosis are not a covered benefit and excluded from coverage in accordance with the terms and conditions of you plan benefit.

## 2022-10-22 NOTE — Addendum Note (Signed)
Addended by: Marian Sorrow on: 10/22/2022 10:35 AM   Modules accepted: Orders

## 2022-10-22 NOTE — Addendum Note (Signed)
Addended by: Erlene Quan on: 10/22/2022 04:14 PM   Modules accepted: Orders

## 2022-10-22 NOTE — Telephone Encounter (Signed)
Spoke to pt told I left detailed message about  Zepbound was denied due to not a cvoered benefit and excluded from her plan. Also told her I sent 29  pages of office notes and still not covered. Pt verbalized understanding.

## 2022-10-22 NOTE — Telephone Encounter (Signed)
Spoke to pt she said Vyvanse that was sent in is no longer available. Pt wants to know if she can change to Adderall XR, ? Dose.

## 2022-10-25 NOTE — Telephone Encounter (Signed)
Lindsey Avila states:  - In order for patient to use the coupon, he will need the dx code for the mounjaro. Can be reached back at [phone]. You can either speak to him or [name], his colleague.

## 2022-10-26 NOTE — Telephone Encounter (Signed)
Called Lindsey Avila on [employer] spoke to [place], told him returning Spencer's call, need Dx code for [facility] in order to use coupon. Eulogio Bear Dx code is E66.9 Obesity. Ben verbalized understanding.

## 2023-03-28 DIAGNOSIS — F411 Generalized anxiety disorder: Secondary | ICD-10-CM | POA: Diagnosis not present

## 2023-05-09 DIAGNOSIS — F411 Generalized anxiety disorder: Secondary | ICD-10-CM | POA: Diagnosis not present

## 2023-05-16 DIAGNOSIS — F411 Generalized anxiety disorder: Secondary | ICD-10-CM | POA: Diagnosis not present

## 2023-05-31 DIAGNOSIS — F411 Generalized anxiety disorder: Secondary | ICD-10-CM | POA: Diagnosis not present

## 2023-06-07 DIAGNOSIS — F411 Generalized anxiety disorder: Secondary | ICD-10-CM | POA: Diagnosis not present

## 2023-06-14 DIAGNOSIS — F411 Generalized anxiety disorder: Secondary | ICD-10-CM | POA: Diagnosis not present

## 2023-06-21 DIAGNOSIS — F411 Generalized anxiety disorder: Secondary | ICD-10-CM | POA: Diagnosis not present

## 2023-07-05 DIAGNOSIS — F411 Generalized anxiety disorder: Secondary | ICD-10-CM | POA: Diagnosis not present

## 2023-07-12 DIAGNOSIS — F411 Generalized anxiety disorder: Secondary | ICD-10-CM | POA: Diagnosis not present

## 2023-07-19 DIAGNOSIS — F411 Generalized anxiety disorder: Secondary | ICD-10-CM | POA: Diagnosis not present

## 2023-07-25 ENCOUNTER — Encounter: Payer: Self-pay | Admitting: Physician Assistant

## 2023-07-26 DIAGNOSIS — F411 Generalized anxiety disorder: Secondary | ICD-10-CM | POA: Diagnosis not present

## 2023-07-29 DIAGNOSIS — Z01419 Encounter for gynecological examination (general) (routine) without abnormal findings: Secondary | ICD-10-CM | POA: Diagnosis not present

## 2023-07-29 DIAGNOSIS — Z1231 Encounter for screening mammogram for malignant neoplasm of breast: Secondary | ICD-10-CM | POA: Diagnosis not present

## 2023-07-29 DIAGNOSIS — Z13220 Encounter for screening for lipoid disorders: Secondary | ICD-10-CM | POA: Diagnosis not present

## 2023-07-29 DIAGNOSIS — Z130 Encounter for screening for diseases of the blood and blood-forming organs and certain disorders involving the immune mechanism: Secondary | ICD-10-CM | POA: Diagnosis not present

## 2023-07-29 DIAGNOSIS — L659 Nonscarring hair loss, unspecified: Secondary | ICD-10-CM | POA: Diagnosis not present

## 2023-07-29 DIAGNOSIS — Z1329 Encounter for screening for other suspected endocrine disorder: Secondary | ICD-10-CM | POA: Diagnosis not present

## 2023-07-29 LAB — HM MAMMOGRAPHY

## 2023-08-02 DIAGNOSIS — F411 Generalized anxiety disorder: Secondary | ICD-10-CM | POA: Diagnosis not present

## 2023-08-07 ENCOUNTER — Encounter: Payer: Self-pay | Admitting: Physician Assistant

## 2023-08-13 DIAGNOSIS — F411 Generalized anxiety disorder: Secondary | ICD-10-CM | POA: Diagnosis not present

## 2023-08-23 DIAGNOSIS — F411 Generalized anxiety disorder: Secondary | ICD-10-CM | POA: Diagnosis not present

## 2023-08-30 DIAGNOSIS — F411 Generalized anxiety disorder: Secondary | ICD-10-CM | POA: Diagnosis not present

## 2023-09-04 DIAGNOSIS — E7800 Pure hypercholesterolemia, unspecified: Secondary | ICD-10-CM | POA: Diagnosis not present

## 2023-09-04 DIAGNOSIS — Z79899 Other long term (current) drug therapy: Secondary | ICD-10-CM | POA: Diagnosis not present

## 2023-09-04 DIAGNOSIS — F32A Depression, unspecified: Secondary | ICD-10-CM | POA: Diagnosis not present

## 2023-09-04 DIAGNOSIS — F909 Attention-deficit hyperactivity disorder, unspecified type: Secondary | ICD-10-CM | POA: Diagnosis not present

## 2023-09-04 DIAGNOSIS — E785 Hyperlipidemia, unspecified: Secondary | ICD-10-CM | POA: Diagnosis not present

## 2023-09-04 DIAGNOSIS — F419 Anxiety disorder, unspecified: Secondary | ICD-10-CM | POA: Diagnosis not present

## 2023-09-04 DIAGNOSIS — G4700 Insomnia, unspecified: Secondary | ICD-10-CM | POA: Diagnosis not present

## 2023-09-20 DIAGNOSIS — F411 Generalized anxiety disorder: Secondary | ICD-10-CM | POA: Diagnosis not present

## 2023-10-18 DIAGNOSIS — F411 Generalized anxiety disorder: Secondary | ICD-10-CM | POA: Diagnosis not present

## 2023-10-25 DIAGNOSIS — F411 Generalized anxiety disorder: Secondary | ICD-10-CM | POA: Diagnosis not present

## 2023-11-01 DIAGNOSIS — F411 Generalized anxiety disorder: Secondary | ICD-10-CM | POA: Diagnosis not present

## 2023-11-15 DIAGNOSIS — F411 Generalized anxiety disorder: Secondary | ICD-10-CM | POA: Diagnosis not present

## 2023-11-22 DIAGNOSIS — F411 Generalized anxiety disorder: Secondary | ICD-10-CM | POA: Diagnosis not present

## 2023-12-06 ENCOUNTER — Ambulatory Visit (HOSPITAL_COMMUNITY): Admitting: Mental Health

## 2023-12-06 DIAGNOSIS — F3341 Major depressive disorder, recurrent, in partial remission: Secondary | ICD-10-CM | POA: Insufficient documentation

## 2023-12-06 DIAGNOSIS — F331 Major depressive disorder, recurrent, moderate: Secondary | ICD-10-CM

## 2023-12-06 DIAGNOSIS — F900 Attention-deficit hyperactivity disorder, predominantly inattentive type: Secondary | ICD-10-CM

## 2023-12-06 NOTE — Progress Notes (Signed)
 Comprehensive Clinical Assessment (CCA) Note Virtual Visit via Video Note  I connected with Lindsey Avila on 12/06/2023 at  2:00 PM EDT by a video enabled telemedicine application and verified that I am speaking with the correct person using two identifiers.  Location: Patient: home address on file Provider: home office   I discussed the limitations of evaluation and management by telemedicine and the availability of in person appointments. The patient expressed understanding and agreed to proceed.  I discussed the assessment and treatment plan with the patient. The patient was provided an opportunity to ask questions and all were answered. The patient agreed with the plan and demonstrated an understanding of the instructions.   The patient was advised to call back or seek an in-person evaluation if the symptoms worsen or if the condition fails to improve as anticipated.  I provided 45 minutes of non-face-to-face time during this encounter.   Stephan Minister [place], [facility]   12/06/2023 Lindsey Avila [id number]  Chief Complaint:  Chief Complaint  Patient presents with   Establish Care   Anxiety   Depression   Visit Diagnosis: Major depression recurrent moderate, ADHD by history    CCA Screening, Triage and Referral (STR)  Patient Reported Information Referral name: Therapist  Whom do you see for routine medical problems? Primary Care  What Is the Reason for Your Visit/Call Today? " I have a therapist but I am cash paying her, so it may be time to look at that. I have ADHD, depression and anxiety. I don't know if the meds are doing what they are supposed to."  How Long Has This Been Causing You Problems? > than 6 months  What Do You Feel Would Help You the Most Today? Treatment for Depression or other mood problem   Have You Recently Been in Any Inpatient Treatment (Hospital/Detox/Crisis Center/28-Day Program)? No  Have You Ever Received Services From [employer] Before?  No  Have You Recently Had Any Thoughts About Hurting Yourself? No  Are You Planning to Commit Suicide/Harm Yourself At This time? No   Have you Recently Had Thoughts About Hurting Someone Karolee Ohs? No  Have You Used Any Alcohol or Drugs in the Past 24 Hours? Yes  Do You Currently Have a Therapist/Psychiatrist? Yes  Name of Therapist/Psychiatrist: Therapist- Elease Hashimoto Burkert  Have You Been Recently Discharged From Any Office Practice or Programs? No     CCA Screening Triage Referral Assessment Type of Contact: Tele-Assessment  Is this Initial or Reassessment? Initial Assessment  Collateral Involvement: None (home office)  Is CPS involved or ever been involved? Never  Is APS involved or ever been involved? Never   Patient Determined To Be At Risk for Harm To Self or Others Based on Review of Patient Reported Information or Presenting Complaint? No  Method: No Plan  Availability of Means: No access or NA  Intent: Vague intent or NA  Notification Required: No need or identified person  Additional Information for Danger to Others Potential: No data recorded Additional Comments for Danger to Others Potential: None  Are There Guns or Other Weapons in Your Home? No  Types of Guns/Weapons: None  Are These Weapons Safely Secured?                            No data recorded Who Could Verify You Are Able To Have These Secured: NA  Do You Have any Outstanding Charges, Pending Court Dates, Parole/Probation? Denies  Contacted To Inform  of Risk of Harm To Self or Others: No data recorded  Location of Assessment: Other (comment) (address on file)   Does Patient Present under Involuntary Commitment? No  IVC Papers Initial File Date: No data recorded  [place] of Residence: [place]   Patient Currently Receiving the Following Services: Individual Therapy   Determination of Need: Routine (7 days)   Options For Referral: Medication Management; Outpatient  Therapy     CCA Biopsychosocial Intake/Chief Complaint:  " I have a therapist but I am cash paying her, so it may be time to look at that. I have ADHD, depression and anxiety. I don't know if the meds are doing what they are supposed to." Lindsey Avila is a 48 year old Caucasian divorced female who presents for virtual walk in with [facility] OP; referred by her insurance for services. Reports to have been diagnosed in the past with depression, anxiety and ADHD. Shares hx of depression dating back to her 50's; noting hx of post partum with the birth of her son. Shares diagnoses of anxiety and ADHD in 2020. Shares current stressors related to current events in the world and various psychosocial stressors. Notes biggest stressor to be employment to be slow and causing some financial concerns.Shares to currently have therapist in which she is paying out of pocket for (interested in switching to utilize insurance) with PCP prescribing medications of adderral, venlafaxine and trazodone. Unclear if current medications and dosages are holding needing therapeutic effect.  Current Symptoms/Problems: "meh" depression and focus   Patient Reported Schizophrenia/Schizoaffective Diagnosis in Past: No   Strengths: " I'm smart"  Preferences: virtual appointments; female provider  Abilities: "I'm good at mothering people.Supporting people."   Type of Services Patient Feels are Needed: Needs: "excutive function."   Initial Clinical Notes/Concerns: No data recorded  Mental Health Symptoms Depression:  Irritability; Sleep (too much or little); Fatigue; Change in energy/activity; Difficulty Concentrating (difficulty falling and staying asleeping, anhedonia; denies hx of suicidal thoughts or actions. Denies self harm behaviors)   Duration of Depressive symptoms: Greater than two weeks   Mania:  None; Racing thoughts   Anxiety:   Worrying; Tension; Restlessness; Sleep; Irritability; Fatigue; Difficulty concentrating  (hx of anxiety attacks denies for this to occur oftoen)   Psychosis:  None   Duration of Psychotic symptoms: No data recorded  Trauma:  None   Obsessions:  None   Compulsions:  None   Inattention:  Forgetful; Loses things; Disorganized; Fails to pay attention/makes careless mistakes; Symptoms before age 76 (diagnosed in 2020- takes adderral ER. Has been seen by [facility])   Hyperactivity/Impulsivity:  None   Oppositional/Defiant Behaviors:  None   Emotional Irregularity:  None   Other Mood/Personality Symptoms:  No data recorded   Mental Status Exam Appearance and self-care  Stature:  Average   Weight:  Average weight   Clothing:  Casual   Grooming:  Well-groomed   Cosmetic use:  None   Posture/gait:  Normal   Motor activity:  Not Remarkable   Sensorium  Attention:  Normal   Concentration:  Normal   Orientation:  X5   Recall/memory:  Normal   Affect and Mood  Affect:  Appropriate   Mood:  Euthymic   Relating  Eye contact:  Normal   Facial expression:  Responsive   Attitude toward examiner:  Cooperative   Thought and Language  Speech flow: Clear and Coherent   Thought content:  Appropriate to Mood and Circumstances   Preoccupation:  None   Hallucinations:  None   Organization:  No data recorded  [employer] of Knowledge:  Good   Intelligence:  Average   Abstraction:  Normal   Judgement:  Fair   Reality Testing:  Realistic   Insight:  Good   Decision Making:  Paralyzed; Vacilates   Social Functioning  Social Maturity:  Responsible   Social Judgement:  Normal   Stress  Stressors:  Grief/losses; Financial; Work (father passed last year)   Coping Ability:  Exhausted   Skill Deficits:  Decision making; Activities of daily living (ADHD interferes)   Supports:  Family; Friends/Service system     Religion: Religion/Spirituality 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)  Leisure/Recreation: Leisure / Recreation Do You Have Hobbies?: Yes Leisure and Hobbies: Pottery, build things and getting into gardening  Exercise/Diet: Exercise/Diet Do You Exercise?: No Have You Gained or Lost A Significant Amount of Weight in the Past Six Months?: No Do You Follow a Special Diet?: No Do You Have Any Trouble Sleeping?: Yes Explanation of Sleeping Difficulties: difficulty falling and staying asleep   CCA Employment/Education Employment/Work Situation: Employment / Work Situation 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?: No  Education: Education Is Patient Currently Attending School?: No Last Grade Completed: 12 Did Garment/textile technologist From [employer]?: Yes Did Theme park manager?: Yes What Type of College Degree Do you Have?: BS -graphic arts and communication degree Did You Attend Graduate School?: No What Was Your Major?: BS -graphic arts and communication degree Did You Have Any Special Interests In School?: Would like to get new skills Did You Have An Individualized Education Program (IIEP): No Did You Have Any Difficulty At School?: Yes (trouble focusing) Patient's Education Has Been Impacted by Current Illness: No   CCA Family/Childhood History Family and Relationship History: Family history Marital status: Divorced Divorced, when?: 2009 separated, divoroced 2010 - Married for 2 years What types of issues  is patient dealing with in the relationship?: - Are you sexually active?: No What is your sexual orientation?: heterosexual Does patient have children?: Yes How many children?: 38 (x 15 37 year old son) How is patient's relationship with their children?: Shares to have a good relationship with her son  Childhood History:  Childhood History By whom was/is the patient raised?: Mother, Both parents Additional childhood history information: Shares to have been raised by mother; shares to have been raised in [place]. Describes her childhood as "fine, It wasn't idealic but my dad wasn't home a lot." Description of patient's relationship with caregiver when they were a child: Mother: "we were really tight" Father: "I think he was a narccisist in some respects. Chasing for attention, he was not around much. Eggshells; as long as you were doing what was expected of him. Not to challenge him ever." Patient's description of current relationship with people who raised him/her: Mother: :"it's good now." Father: deceased How were you disciplined when you got in trouble as a child/adolescent?: - Does patient have siblings?: No Did patient  suffer any verbal/emotional/physical/sexual abuse as a child?: No Did patient suffer from severe childhood neglect?: Yes Patient description of severe childhood neglect: father was emotionally neglectful Has patient ever been sexually abused/assaulted/raped as an adolescent or adult?: No Was the patient ever a victim of a crime or a disaster?: No Witnessed domestic violence?: No Has patient been affected by domestic violence as an adult?: No  Child/Adolescent Assessment:     CCA Substance Use Alcohol/Drug Use: Alcohol / Drug Use Prescriptions: Adderral, Venaflaxine, Trazodone - prescribed by PCP History of alcohol / drug use?: Yes Substance #1 Name of Substance 1: Alcohol 1 - Age of First Use: 16 1 - Amount (size/oz): 1 to 2 drinks 1 - Frequency: once amnth 1 -  Duration: years 1 - Last Use / Amount: last night; one drink 1 - Method of Aquiring: purchase 1- Route of Use: drinking                       ASAM's:  Six Dimensions of Multidimensional Assessment  Dimension 1:  Acute Intoxication and/or Withdrawal Potential:      Dimension 2:  Biomedical Conditions and Complications:      Dimension 3:  Emotional, Behavioral, or Cognitive Conditions and Complications:     Dimension 4:  Readiness to Change:     Dimension 5:  Relapse, Continued use, or Continued Problem Potential:     Dimension 6:  Recovery/Living Environment:     ASAM Severity Score:    ASAM Recommended Level of Treatment:     Substance use Disorder (SUD)    Recommendations for Services/Supports/Treatments: Recommendations for Services/Supports/Treatments Recommendations For Services/Supports/Treatments: Individual Therapy, Medication Management  DSM5 Diagnoses: Patient Active Problem List   Diagnosis Date Noted   Moderate recurrent major depression (HCC) 12/06/2023   Insomnia 09/20/2021   Attention deficit disorder 06/06/2020   Vitamin D deficiency 10/07/2018   Obesity with serious comorbidity 08/23/2017   Summary:  Lindsey Avila is a 48 year old Caucasian divorced female who presents for virtual walk in with [facility] OP; referred by her insurance for services. Reports to have been diagnosed in the past with depression, anxiety and ADHD. Shares hx of depression dating back to her 33's; noting hx of post partum with the birth of her son. Shares diagnoses of anxiety and ADHD in 2020. Shares current stressors related to current events in the world and various psychosocial stressors. Notes biggest stressor to be employment to be slow and causing some financial concerns. Shares to currently have therapist in which she is paying out of pocket for (interested in switching to utilize insurance) with PCP prescribing medications of adderral, venlafaxine and trazodone. Unclear if current  medications and dosages are holding needing therapeutic effect.   Lindsey Avila presents for virtual tele-health walk in alert and oriented x 5. Mood and affect adequate. Speech clear and coherent at normal rate and tone. Engaged and cooperative with assessment. Good eye-contact, dressed appropriate for weather. Lindsey Avila shares history of depression dating back to her 72's with history of post partum and feelings of depression during pregnancy with son. Shares ongoing concerns for mood noting feelings of low mood, anhedonia and low energy and fatigue. Denies hx of suicidal thoughts or actions, no hx of self harm behaviors. Notes anxiety sxs with excessive worry, tension, restlessness and difficulty falling asleep. Reports hx of anxiety attacks but denies for this to be a frequent occurrence for her. Denies mood swings/mania; denies psychotic sxs. Notes hx of ADHD dx in adulthood but  shares sxs to have been present in grade school with ongoing inattention sxs. Current takes adderral for sxs. Denies trauma sxs but shares for father in childhood to have been emotionally neglectful of her. Reports use of alcohol at a rate of approximately once a month of 1 to 2 drinks in a sitting; with last use yesterday of x 1 drink. Shares adequate supports in community. Current works- self employed and notes for work currently to be reduced with presents as main stressor for her at this time. Denies legal concerns. Reports stable housing with x 74 -35 year old son. Denies SI/HI/AVH. CSSRS, pain, nutrition, GAD and PHQ completed.  Meets criteria for Major depression recurrent moderate with anxious distress; ADHD by history.   Txt plan will be completed at next session.      12/06/2023    2:03 PM 06/23/2021    8:15 AM  GAD 7 : Generalized Anxiety Score  Nervous, Anxious, on Edge 2 1  Control/stop worrying 2 2  Worry too much - different things 3 2  Trouble relaxing 2 1  Restless 1 0  Easily annoyed or irritable 2 1  Afraid -  awful might happen 2 3  Total GAD 7 Score 14 10  Anxiety Difficulty Somewhat difficult Somewhat difficult       12/06/2023    2:05 PM 09/20/2021    8:55 AM 06/23/2021    8:14 AM 06/06/2020    1:52 PM 10/07/2018   12:05 PM  Depression screen PHQ 2/9  Decreased Interest 3 0 1 0 1  Down, Depressed, Hopeless 3 0 1 0 1  PHQ - 2 Score 6 0 2 0 2  Altered sleeping 2  3  0  Tired, decreased energy 3  1  3   Change in appetite 1  2  2   Feeling bad or failure about yourself  1  1  1   Trouble concentrating 3  3  3   Moving slowly or fidgety/restless 1  0  0  Suicidal thoughts 0  0  0  PHQ-9 Score 17  12  11   Difficult doing work/chores Somewhat difficult  Somewhat difficult       Patient Centered Plan: Patient is on the following Treatment Plan(s):  Anxiety and Depression   Referrals to Alternative Service(s): Referred to Alternative Service(s):   Place:   Date:   Time:    Referred to Alternative Service(s):   Place:   Date:   Time:    Referred to Alternative Service(s):   Place:   Date:   Time:    Referred to Alternative Service(s):   Place:   Date:   Time:      Collaboration of Care: Medication Management AEB Referral for psychiatric evaluation  Patient/Guardian was advised Release of Information must be obtained prior to any record release in order to collaborate their care with an outside provider. Patient/Guardian was advised if they have not already done so to contact the registration department to sign all necessary forms in order for [place] to release information regarding their care.   Consent: Patient/Guardian gives verbal consent for treatment and assignment of benefits for services provided during this visit. Patient/Guardian expressed understanding and agreed to proceed.   Dorris Singh, [facility]

## 2023-12-13 ENCOUNTER — Ambulatory Visit (INDEPENDENT_AMBULATORY_CARE_PROVIDER_SITE_OTHER): Admitting: Psychiatry

## 2023-12-13 ENCOUNTER — Encounter (HOSPITAL_COMMUNITY): Payer: Self-pay | Admitting: Psychiatry

## 2023-12-13 DIAGNOSIS — F900 Attention-deficit hyperactivity disorder, predominantly inattentive type: Secondary | ICD-10-CM | POA: Diagnosis not present

## 2023-12-13 DIAGNOSIS — F32A Depression, unspecified: Secondary | ICD-10-CM | POA: Diagnosis not present

## 2023-12-13 MED ORDER — AMPHETAMINE-DEXTROAMPHET ER 10 MG PO CP24: 10.0000 mg | ORAL_CAPSULE | Freq: Every day | ORAL | 0 refills | Status: DC

## 2023-12-13 NOTE — Progress Notes (Signed)
 Psychiatric Initial Adult Assessment  Virtual Visit via Video Note  I connected with Lindsey Avila on 12/13/23 at  8:00 AM EDT by a video enabled telemedicine application and verified that I am speaking with the correct person using two identifiers.  Location: Patient: Home Provider: Clinic   I discussed the limitations of evaluation and management by telemedicine and the availability of in person appointments. The patient expressed understanding and agreed to proceed.  I provided 45 minutes of non-face-to-face time during this encounter.   Patient Identification: Lindsey Avila MRN:  [id number] Date of Evaluation:  12/13/2023 Referral Source: Counselor Chief Complaint:  "I'm not sure if my medication is regulated"  Visit Diagnosis:    ICD-10-CM   1. Attention deficit hyperactivity disorder (ADHD), predominantly inattentive type  F90.0 amphetamine-dextroamphetamine (ADDERALL XR) 10 MG 24 hr capsule    2. Mild depression  F32.A       History of Present Illness:  48 year old female seen today for initial psychiatric evaluation. She has a psychiatric history of Anxiety, depression, and ADHD. Currently she is managed on Adderall XR 20 mg daily (last filled on 12/02/2023), Effexor 75 mg daily, and Trazodone 50 mg nightly as needed. Patient also has a prescription for Adderall 5 mg immediate release (last filled 10/08/2023). She notes that her medications are somewhat effective in managing her psychiatric conditions.  Today she is well-groomed, pleasant, cooperative, and engaged in conversation.  Patient informed Clinical research associate that she does not feel that her Adderall is regulated.  She notes that she continues to be forgetful, disorganized, inattentive to mentally taxing tasks, and at times has poor listening skills.  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  She notes that she requires more concentration to help complete her task.  Patient has trialed Vaughan Basta, Vyvanse, Wellbutrin (has  an allergic reaction), and Strattera (caused blurred vision) without success.  Patient was first seen by [place] attention specialist however lack of finances caused her to have to be treated by her primary care doctor.  Her medications are now prescribed by her primary care doctor however she reports that she would like her medications now prescribed by a psychiatric provider to get a more specialized care.  Patient informed Clinical research associate that recently she was diagnosed with sleep apnea.  She does note that she sleeps approximately 6 to 7 hours but feels that it may be interrupted.  She was not given a CPAP machine.  Provider informed patient that her sleep/oxygen deprivation may interfere with her concentration in the morning.  Provider recommended patient follow-up with her PCP to further discuss the sleep apnea as well as treatment.  She endorsed understanding and agreed.  Patient informed writer that she does suffer from anxiety and depression.  She notes that Effexor has been somewhat effective in managing these conditions.  Today provide conducted a GAD-7 and patient scored a 12.  Provider also conducted PHQ-9 and patient scored 12.  Patient notes that she primarily worries about finances, her mother, her 89 year old son, and her health.  Today she denies SI/HI/AVH, mania, paranoia.  Patient reports that the death of her father in Jan 08, 2022 was traumatic.  During this time she also reports that her mother was hospitalized and got a plate taking out of her head.  She endorses flashbacks but denies avoidant behaviors or nightmares.  Patient denies alcohol, tobacco, or illegal drug use.  Patient is currently on Zepbound and notes that when she drinks a small amount of alcohol she feels  intoxicated.  She notes that she has stayed away from alcohol because of this reason.  Provider recommended increasing Adderall to help manage ADHD symptoms.  Patient given Adderall XR 10 mg to be taken daily.  Her other  medications were not refilled as she receives them from her PCP and it is not time for refill.  She will follow-up with her counselor for therapy.  No other concerns at this time.  Associated Signs/Symptoms: Depression Symptoms:  depressed mood, insomnia, psychomotor agitation, difficulty concentrating, impaired memory, loss of energy/fatigue, disturbed sleep, (Hypo) Manic Symptoms:  Distractibility, Flight of Ideas, Irritable Mood, Anxiety Symptoms:   Mild anxiety Psychotic Symptoms:   Denies PTSD Symptoms: Had a traumatic exposure:  Reports that her father died in Jan 06, 2022. She also notes that during this time her mother had a plate removed from her head.  Re-experiencing:  Flashbacks  Past Psychiatric History: Anxiety, depression, and ADHD  Previous Psychotropic Medications:  Xanax, Vyvanse, qulbree, allergic to Wellbutrin (hives), and stratterra (caused blurred vision), Effexor, hydroxyzine, lexapro (irritable), Celexa (nightmares), naltrexone-bupropion (allergie) and trazodone  Substance Abuse History in the last 12 months:  No.  Consequences of Substance Abuse: NA  Past Medical History:  Past Medical History:  Diagnosis Date   ADHD (attention deficit hyperactivity disorder) 05/2020   Allergy    Anemia    in remote past due to heavy periods   Anxiety    Chronic back pain    Depression 01-07-08   prior with pregnancy   Encounter for routine gynecological examination    Dr. Waynard Reeds   GERD (gastroesophageal reflux disease)    IUD (intrauterine device) in place    IUD replaced 08/13/2018 with Mirena; Dr. Waynard Reeds   Obesity    Recurrent urinary tract infection    no prior urology consult   Seasonal allergies    Skin anomaly    keratinosis   Wears glasses     Past Surgical History:  Procedure Laterality Date   CESAREAN SECTION  01-07-2008   KNEE ARTHROSCOPY     left, arthroscopic    Family Psychiatric History: undiagnosed mental health issues with family member. Son  ADHD  Family History:  Family History  Problem Relation Age of Onset   Hypertension Mother    Hypothyroidism Mother    Obesity Mother    Macular degeneration Mother    Cataracts Mother    Cancer Mother        skin cancer   Asthma Mother    Hypertension Father    Cancer Father        basal cell skin   Benign prostatic hyperplasia Father    Alcohol abuse Father    COPD Maternal Grandmother    Asthma Maternal Grandmother    Dementia Maternal Grandfather    Pancreatic cancer Paternal Grandmother    Cancer Paternal Grandmother        pancreatic   Heart disease Paternal Grandfather        MI   Hypertension Paternal Grandfather    Obesity Maternal Uncle    Stroke Neg Hx    Diabetes Neg Hx    Colon cancer Neg Hx    Esophageal cancer Neg Hx    Rectal cancer Neg Hx    Stomach cancer Neg Hx     Social History:   Social History   Socioeconomic History   Marital status: Divorced    Spouse name: Not on file   Number of children: Not on file   Years  of education: Not on file   Highest education level: Not on file  Occupational History   Not on file  Tobacco Use   Smoking status: Former    Current packs/day: 0.00    Types: Cigarettes    Quit date: 09/17/1998    Years since quitting: 25.2   Smokeless tobacco: Never   Tobacco comments:    Socially and infrequently  Vaping Use   Vaping status: Never Used  Substance and Sexual Activity   Alcohol use: Yes    Alcohol/week: 1.0 standard drink of alcohol    Types: 1 Glasses of wine per week    Comment: Very infrequent   Drug use: No   Sexual activity: Not Currently    Birth control/protection: I.U.D.  Other Topics Concern   Not on file  Social History Narrative   Lives with 12yo son, cat and dog.     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  [employer]vertising.    Exercise - kickboxing, but prior  with treadmill, cardio, circuit training.   05/2020   Social Drivers of Health   Financial Resource Strain:  Medium Risk (12/06/2023)   Overall Financial Resource Strain (CARDIA)    Difficulty of Paying Living Expenses: Somewhat hard  Food Insecurity: No Food Insecurity (12/06/2023)   Hunger Vital Sign    Worried About Running Out of Food in the Last Year: Never true    Ran Out of Food in the Last Year: Never true  Transportation Needs: No Transportation Needs (12/06/2023)   PRAPARE - Administrator, Civil Service (Medical): No    Lack of Transportation (Non-Medical): No  Physical Activity: Inactive (12/06/2023)   Exercise Vital Sign    Days of Exercise per Week: 0 days    Minutes of Exercise per Session: 0 min  Stress: Stress Concern Present (12/06/2023)   [employer] of Occupational Health - Occupational Stress Questionnaire    Feeling of Stress : To some extent  Social Connections: Socially Isolated (12/06/2023)   Social Connection and Isolation Panel [NHANES]    Frequency of Communication with Friends and Family: More than three times a week    Frequency of Social Gatherings with Friends and Family: Three times a week    Attends Religious Services: Never    Active Member of Clubs or Organizations: No    Attends Banker Meetings: Never    Marital Status: Divorced    Additional Social History: Patient is divorced. She has one son Montez Morita. She works as an Biomedical scientist.   Allergies:   Allergies  Allergen Reactions   Sulfa Antibiotics Anaphylaxis and Hives   Bupropion Hives   Contrave [Naltrexone-Bupropion Hcl Er] Hives   Latex    Penicillins     unknown    Metabolic Disorder Labs: Lab Results  Component Value Date   HGBA1C 4.8 05/18/2022   MPG 91 08/23/2017   MPG 94 10/28/2014   No results found for: "PROLACTIN" Lab Results  Component Value Date   CHOL 162 05/18/2022   TRIG 77 05/18/2022   HDL 48 05/18/2022   CHOLHDL 4 06/23/2021   VLDL 17.4 06/23/2021   LDLCALC 99 05/18/2022   LDLCALC 108 (H) 06/23/2021   Lab Results  Component Value Date    TSH 1.90 05/18/2022    Therapeutic Level Labs: No results found for: "LITHIUM" No results found for: "CBMZ" No results found for: "VALPROATE"  Current Medications: Current Outpatient Medications  Medication Sig Dispense  Refill   amphetamine-dextroamphetamine (ADDERALL XR) 10 MG 24 hr capsule Take 1 capsule (10 mg total) by mouth daily. 30 capsule 0   amphetamine-dextroamphetamine (ADDERALL XR) 20 MG 24 hr capsule Take 1 capsule (20 mg total) by mouth every morning. 30 capsule 0   EPINEPHrine 0.3 mg/0.3 mL IJ SOAJ injection Inject 0.3 mg into the muscle as needed for anaphylaxis. 2 each 0   FIBER ADULT GUMMIES PO Take 2 each by mouth daily in the afternoon.     levonorgestrel (MIRENA, 52 MG,) 20 MCG/24HR IUD Mirena 20 mcg/24 hours (6 yrs) 52 mg intrauterine device  Take 1 device by intrauterine route.     tirzepatide (MOUNJARO) 5 MG/0.5ML Pen Inject 5 mg into the skin once a week. 2 mL 0   tirzepatide (ZEPBOUND) 5 MG/0.5ML Pen Inject 5 mg into the skin once a week. 2 mL 1   traZODone (DESYREL) 50 MG tablet TAKE 1/2 TO 1 TABLET BY MOUTH AT BEDTIME AS NEEDED FOR SLEEP 30 tablet 3   No current facility-administered medications for this visit.    Musculoskeletal: Strength & Muscle Tone: within normal limits and Telehealth visit Gait & Station: normal, Tele Patient leans: N/A  Psychiatric Specialty Exam: Review of Systems  There were no vitals taken for this visit.There is no height or weight on file to calculate BMI.  General Appearance: Well Groomed  Eye Contact:  Good  Speech:  Clear and Coherent and Normal Rate  Volume:  Normal  Mood:  Anxious and Depressed  Affect:  Appropriate  Thought Process:  Coherent, Goal Directed, and Linear  Orientation:  Full (Time, Place, and Person)  Thought Content:  WDL and Logical  Suicidal Thoughts:  No  Homicidal Thoughts:  No  Memory:  Immediate;   Good Recent;   Good Remote;   Good  Judgement:  Good  Insight:  Good  Psychomotor  Activity:  Normal  Concentration:  Concentration: Good and Attention Span: Good  Recall:  Good  Fund of Knowledge:Good  Language: Good  Akathisia:  No  Handed:  Right  AIMS (if indicated):  not done  Assets:  Communication Skills Desire for Improvement Financial Resources/Insurance Housing Leisure Time Physical Health Social Support Transportation  ADL's:  Intact  Cognition: WNL  Sleep:  Good   Screenings: GAD-7    Loss adjuster, chartered Office Visit from 12/13/2023 in [facility] Counselor from 12/06/2023 in [facility] Office Visit from 06/23/2021 in [facility]  Total GAD-7 Score 12 14 10       PHQ2-9    Flowsheet Row Office Visit from 12/13/2023 in [facility] Counselor from 12/06/2023 in [facility] Video Visit from 09/20/2021 in [facility] [place] Office Visit from 06/23/2021 in [facility] [employer] Visit from 06/06/2020 in [facility]  PHQ-2 Total Score 2 6 0 2 0  PHQ-9 Total Score 12 17 -- 12 --      Flowsheet Row Office Visit from 12/13/2023 in [facility] Counselor from 12/06/2023 in [facility]  C-SSRS RISK CATEGORY No Risk No Risk       Assessment and Plan: Patient endorses symptoms of anxiety, depression, and ADHD. Patient informed Clinical research associate that recently she was diagnosed with sleep apnea.  She does note that she sleeps approximately 6 to 7 hours but feels that it may  be interrupted.  She was not given a CPAP machine.  Provider informed patient that her sleep/oxygen deprivation may interfere with her concentration in the morning.  Provider recommended patient follow-up with her PCP to further discuss the sleep apnea as well as treatment.  She endorsed understanding and agreed. Provider recommended  increasing Adderall to help manage ADHD symptoms.  Patient given Adderall XR 10 mg to be taken daily.  Her other medications were not refilled as she receives them from her PCP and it is not time for refill.     1. Attention deficit hyperactivity disorder (ADHD), predominantly inattentive type (Primary)  Start- amphetamine-dextroamphetamine (ADDERALL XR) 10 MG 24 hr capsule; Take 1 capsule (10 mg total) by mouth daily.  Dispense: 30 capsule; Refill: 0  2. Mild depression     Collaboration of Care: Other provider involved in patient's care AEB primary psychiatric provider, PCP, counselor  Patient/Guardian was advised Release of Information must be obtained prior to any record release in order to collaborate their care with an outside provider. Patient/Guardian was advised if they have not already done so to contact the registration department to sign all necessary forms in order for [place] to release information regarding their care.   Consent: Patient/Guardian gives verbal consent for treatment and assignment of benefits for services provided during this visit. Patient/Guardian expressed understanding and agreed to proceed.   Follow-up in 2 months  Shanna Cisco, NP 3/28/202511:54 AM

## 2024-01-30 ENCOUNTER — Ambulatory Visit (HOSPITAL_COMMUNITY): Admitting: Mental Health

## 2024-01-30 ENCOUNTER — Encounter (HOSPITAL_COMMUNITY): Payer: Self-pay

## 2024-01-30 DIAGNOSIS — F32A Depression, unspecified: Secondary | ICD-10-CM | POA: Diagnosis not present

## 2024-01-30 DIAGNOSIS — F900 Attention-deficit hyperactivity disorder, predominantly inattentive type: Secondary | ICD-10-CM

## 2024-01-30 NOTE — Progress Notes (Signed)
   THERAPIST PROGRESS NOTE Virtual Visit via Video Note  I connected with Lindsey Avila on 01/30/24 at  3:00 PM EDT by a video enabled telemedicine application and verified that I am speaking with the correct person using two identifiers.  Location: Patient: [address]  Provider: office    I discussed the limitations of evaluation and management by telemedicine and the availability of in person appointments. The patient expressed understanding and agreed to proceed.  I discussed the assessment and treatment plan with the patient. The patient was provided an opportunity to ask questions and all were answered. The patient agreed with the plan and demonstrated an understanding of the instructions.   The patient was advised to call back or seek an in-person evaluation if the symptoms worsen or if the condition fails to improve as anticipated.  I provided 49 minutes of non-face-to-face time during this encounter.   Lindsey Avila, [facility]   Session Time: 3:02 PM   Participation Level: Active  Behavioral Response: CasualAlertWNL  Type of Therapy: Individual Therapy  Treatment Goals addressed: STG: "Not motivated, focus." Lindsey Avila will increase attention and focus with AEB exploration and implementation of daily routine with development of x 3 skills to increase focus within the next 90 days.   ProgressTowards Goals: Initial  Interventions: CBT and Supportive  Summary: Lindsey Avila is a 48 y.o. female who presents with dx of mild depression and ADHD. Presents with chief complaint of "mood is better but hard time focusing." Shares to feel a little more motivated but continues to be disorganized and difficulty competing tasks to completion.  Notes to have purchased books to support in learning better habits and coping skills but has failed to read. Explores with therapist areas in which ADHD sxs have caused difficulties for her. Engaged with therapist in identifying and exploring  behavioral changes and behavioral tailoring support in increase completion of tasks. Agrees to wake up and immediately put on shows and engages with therapist in identifying daily routine to attempt to follow. Engages in treatment plan and agrees with goals. Denies safety concerns. Initial goal.   Suicidal/Homicidal: Nowithout intent/plan  Therapist Response: Therapist engaged Lindsey Avila in tele-therapy session. Reviewed intake assessment and educated on bounds of confidentiality and informed consent. Provided safe space to share concerns and current symptomology. Assessed for current level of functioning and sxs management. Engaged in rapport building and exploration of current desires for therapy services. Educated on medication compliance in conjunction with skills building and behavioral changes to support in increase management of focus and attention. Engaged interviewing current daily routine and explored areas of change to support in greater outcomes. Discussed skills and supported in outlining possible routine. Reviewed treatment plan and assessed for safety. Provided follow up.   Plan: Return again in  x 6 weeks.  Diagnosis: Mild depression  Attention deficit hyperactivity disorder (ADHD), predominantly inattentive type  Collaboration of Care: Other None  Patient/Guardian was advised Release of Information must be obtained prior to any record release in order to collaborate their care with an outside provider. Patient/Guardian was advised if they have not already done so to contact the registration department to sign all necessary forms in order for us  to release information regarding their care.   Consent: Patient/Guardian gives verbal consent for treatment and assignment of benefits for services provided during this visit. Patient/Guardian expressed understanding and agreed to proceed.   Lindsey Avila, [facility] 01/30/2024

## 2024-02-18 NOTE — Progress Notes (Unsigned)
 BH MD Outpatient Progress Note  02/19/2024 4:25 PM Lindsey Avila  MRN:  [id number]  Assessment:  Lindsey Avila presents for follow-up evaluation. Today, 02/19/24, patient shares history of ADHD diagnosis and current status of symptoms. She was first diagnosed with ADHD in 2020 although reflecting back is able to identify how symptoms were present dating back to high school years. She has difficulty recounting symptoms prior to age 48 and it appears she was able to perform well in school without additional supports. Although it would be atypical for ADHD to newly develop in adulthood, adult patients are not always the most accurate reporters of childhood symptoms and it is quite possible symptoms were present and managed with compensatory measures as a child. Patient currently identifies using a number of methods to compensate for symptoms including calendars, lists, and airtags. She is felt to meet criteria for ADHD inattentive type although will continue to monitor for other psychiatric or medical conditions that may be impacting symptom burden. Notably, patient has been diagnosed with OSA and reviewed recommendation to pursue treatment given impact on attention, energy, and mood. She carries history of depression although reports partial remission on Effexor. Given uncontrolled inattentive symptoms, will further titrate Adderall as below. Recent review of vital signs wnl; patient amenable to obtaining UDS although denies any illicit substance use.  RTC in 5 weeks by video.  Identifying Information: Berdene Avila is a 48 y.o. female with a history of MDD, anxiety, ADHD inattentive type, OSA, and Vitamin D  deficiency who is an established patient with [facility].   Plan:  # ADHD inattentive type Past medication trials: Vyvanse , Wellbutrin  (allergic reaction), Lindsey Avila, Strattera (blurred vision) Status of problem: new problem to this provider Interventions: -- INCREASE Adderall  XR to 25 mg daily -- Continue Adderall IR 5 mg daily PRN ADHD symptoms (patient has enough of current supply) -- Encouraged patient to pursue treatment for diagnosed moderate OSA -- Patient to obtain copy of testing from Lindsey Avila and fax to clinic -- Continue individual psychotherapy with [facility]  # MDD in partial remission Past medication trials: Lexapro  (irritable), Celexa (nightmares), naltrexone -bupropion  (allergic), Xanax , hydroxyzine Status of problem: new problem to this provider Interventions: -- Continue Effexor 75 mg daily -- DECREASE trazodone  to 50 mg nightly for several days given sedation; if tolerated plan to discontinue and use only PRN -- Encouraged patient to pursue treatment for diagnosed moderate OSA  # High risk medication use Interventions: Adderall: -- Vitals obtained at PCP appointment 02/18/24: BP 112/75 HR 76 -- EKG 06/20/20 NSR -- UDS ordered; patient denies use of any illicit substances  Patient was given contact information for behavioral health clinic and was instructed to call 911 for emergencies.   Subjective:  Chief Complaint:  Chief Complaint  Patient presents with   Medication Management    Interval History:   Chart review: -- Last seen by Lindsey Koyanagi NP for initial psychiatric assessment 12/13/23. Diagnoses at that time felt c/w ADHD inattentive type, MDD. Managed on Adderall XR 20 mg daily + Adderall 5 mg IR, Effexor 75 mg daily, and Trazodone  100 mg nightly as needed. Reporting ongoing symptoms of inattention. Adderall XR was increased to 30 mg daily.    Reports she wasn't able to fill Adderall 10 mg as she had already picked up 20 mg script. Has a 5 mg IR but doesn't use this anymore; doesn't feel the need for an afternoon dose.   First diagnosed with ADHD inattentive type in  2020 by a psych NP by clinical assessment. Later received neuropsych testing through Lindsey Avila; reports she  can receive a copy and send to our clinic.  Describes ADHD symptoms as difficulty sustaining attention; unable to divert attention; trouble organizing tasks; procrastinating until the last minute; constantly running late; losing items (although uses airtags to keep track of things); talks constantly; fidgets. Has to use a calendar and reminders app.   Remembers inattention being an issue in high school but has trouble recalling symptoms prior to age 67 yo. Reports she was always talking in class; was being told to be quiet; would space out; room was a complete disaster. Denies trouble sitting still; denies fidgeting as a child. Grades in school were good and graduated in the [employer]. Didn't need any special assistance in school.   Became aware of symptoms in 2007 when she had started a new job and had substantial trouble focusing, couldn't sit still, was leading to problems in job performance.   First started on stimulants in 2020; noticed an immediate effect however has always been concerned about side effects. Has family history of substance use so wants to be careful around medications.   Has been on Effexor for about 6 months for depression. Describes depressive symptoms as "meh" mood, decreased energy and motivation, anhedonia, increased appetite although when really bad may have decreased intake, psychomotor slowing, spend more time in bed. Denies reaching place of hopelessness or passive/active SI.  Currently, mood is "good" and interested in typical activities and hobbies. Denies seasonal patterns or anniversaries that precipitate depressive symptoms. Has experienced episodes of depression since her 42s.   Denies history of hypomania/mania or AVH.  She was diagnosed with moderate OSA recently; has not started CPAP. Reviewed how untreated sleep apnea can impact attention, mood, and energy.   Reports that ADHD symptoms are worse around her cycle. Doesn't have periods as on  Mirena ; knows cycle is happening when feeling angry and unfocused. Currently going through perimenopause.  Given ongoing inattentive symptoms, amenable to conservative titration of Adderall. Amenable to obtaining UDS. Will use existing supply of Adderall IR PRN to guide further titration.   PDMP: -- Adderall ER 20 mg QTY 30 last filled 01/13/24; rx dating back to Feb 2024 -- Adderall IR 5 mg tablet QTY 30 last filled 01/13/24; rx dating back to June 2023   Visit Diagnosis:    ICD-10-CM   1. Attention deficit hyperactivity disorder (ADHD), predominantly inattentive type  F90.0     2. High risk medication use  Z79.899 Drug Screen 12+Alcohol+CRT, Ur    3. MDD (major depressive disorder), recurrent, in partial remission (HCC)  F33.41       Past Psychiatric History:  Diagnoses: MDD; anxiety; ADHD inattentive type Medication trials: Vyvanse , Wellbutrin  (allergic reaction), Lindsey Avila, Strattera (blurred vision); Lexapro  (irritable), Celexa (nightmares), naltrexone -bupropion  (allergic), Xanax , hydroxyzine, trazodone   Hospitalizations: denies Suicide attempts: denies SIB: denies Hx of violence towards others: denies Current access to guns: denies Substance use:  -- Etoh: rarely; once Avila few months -- Denies use of tobacco, or illicit drugs including cannabis  Past Medical History:  Past Medical History:  Diagnosis Date   ADHD (attention deficit hyperactivity disorder) 05/2020   Allergy    Anemia    in remote past due to heavy periods   Anxiety    Chronic back pain    Depression 2009   prior with pregnancy   Encounter for routine gynecological examination    Dr. Reggy Capers  GERD (gastroesophageal reflux disease)    IUD (intrauterine device) in place    IUD replaced 08/13/2018 with Mirena ; Dr. Reggy Capers   Obesity    Recurrent urinary tract infection    no prior urology consult   Seasonal allergies    Skin anomaly    keratinosis   Wears glasses     Past Surgical History:   Procedure Laterality Date   CESAREAN SECTION  2009   KNEE ARTHROSCOPY     left, arthroscopic    Family Psychiatric History:  Mother: depression Dad: alcohol use disorder Maternal grandfather: alcohol use disorder Denies family history of sudden cardiac death or cardiac issues prior to 49 yo  Family History:  Family History  Problem Relation Age of Onset   Hypertension Mother    Hypothyroidism Mother    Obesity Mother    Macular degeneration Mother    Cataracts Mother    Cancer Mother        skin cancer   Asthma Mother    Depression Mother    Hypertension Father    Cancer Father        basal cell skin   Benign prostatic hyperplasia Father    Alcohol abuse Father    Obesity Maternal Uncle    Dementia Maternal Grandfather    Alcohol abuse Maternal Grandfather    COPD Maternal Grandmother    Asthma Maternal Grandmother    Heart disease Paternal Grandfather        MI   Hypertension Paternal Grandfather    Pancreatic cancer Paternal Grandmother    Cancer Paternal Grandmother        pancreatic   Stroke Neg Hx    Diabetes Neg Hx    Colon cancer Neg Hx    Esophageal cancer Neg Hx    Rectal cancer Neg Hx    Stomach cancer Neg Hx     Social History:  Academic/Vocational: 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;  works as Best boy  Social History   Socioeconomic History   Marital status: Divorced    Spouse name: Not on file   Number of children: Not on file   Years of education: Not on file   Highest education level: Not on file  Occupational History   Not on file  Tobacco Use   Smoking status: Former    Current packs/day: 0.00    Types: Cigarettes    Quit date: 09/17/1998    Years since quitting: 25.4   Smokeless tobacco: Never   Tobacco comments:    Socially and infrequently  Vaping Use   Vaping status: Never Used  Substance and Sexual Activity   Alcohol use: Yes    Alcohol/week: 1.0 standard drink of alcohol    Types: 1 Glasses  of wine per week    Comment: Very infrequent - once Avila few months   Drug use: No   Sexual activity: Not Currently    Birth control/protection: I.U.D.  Other Topics Concern   Not on file  Social History Narrative   Lives with 12yo son, cat and dog.     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  [employer].    Exercise - kickboxing, but prior  with treadmill, cardio, circuit training.   05/2020   Social Drivers of Health   Financial Resource Strain: Medium Risk (12/06/2023)   Overall Financial Resource Strain (CARDIA)    Difficulty of Paying Living Expenses: Somewhat hard  Food Insecurity: No  Food Insecurity (12/06/2023)   Hunger Vital Sign    Worried About Running Out of Food in the Last Year: Never true    Ran Out of Food in the Last Year: Never true  Transportation Needs: No Transportation Needs (12/06/2023)   PRAPARE - Administrator, Civil Service (Medical): No    Lack of Transportation (Non-Medical): No  Physical Activity: Inactive (12/06/2023)   Exercise Vital Sign    Days of Exercise per Week: 0 days    Minutes of Exercise per Session: 0 min  Stress: Stress Concern Present (12/06/2023)   [employer] of Occupational Health - Occupational Stress Questionnaire    Feeling of Stress : To some extent  Social Connections: Socially Isolated (12/06/2023)   Social Connection and Isolation Panel [NHANES]    Frequency of Communication with Friends and Family: More than three times a week    Frequency of Social Gatherings with Friends and Family: Three times a week    Attends Religious Services: Never    Active Member of Clubs or Organizations: No    Attends Banker Meetings: Never    Marital Status: Divorced    Allergies:  Allergies  Allergen Reactions   Sulfa Antibiotics Anaphylaxis and Hives   Bupropion  Hives   Contrave  [Naltrexone -Bupropion  Hcl Er] Hives   Latex    Penicillins     unknown    Current Medications: Current  Outpatient Medications  Medication Sig Dispense Refill   levonorgestrel  (MIRENA , 52 MG,) 20 MCG/24HR IUD Mirena  20 mcg/24 hours (6 yrs) 52 mg intrauterine device  Take 1 device by intrauterine route.     traZODone  (DESYREL ) 50 MG tablet TAKE 1/2 TO 1 TABLET BY MOUTH AT BEDTIME AS NEEDED FOR SLEEP (Patient taking differently: Take 100 mg by mouth at bedtime as needed. for sleep) 30 tablet 3   WEGOVY 1 MG/0.5ML SOAJ Inject 1 mg into the skin once a week.     amphetamine -dextroamphetamine  (ADDERALL XR) 25 MG 24 hr capsule Take 1 capsule by mouth Avila morning. 30 capsule 0   [START ON 03/20/2024] amphetamine -dextroamphetamine  (ADDERALL XR) 25 MG 24 hr capsule Take 1 capsule by mouth Avila morning. 30 capsule 0   Cholecalciferol 125 MCG (5000 UT) TABS Take 5,000 Units by mouth daily.     EPINEPHrine  0.3 mg/0.3 mL IJ SOAJ injection Inject 0.3 mg into the muscle as needed for anaphylaxis. (Patient not taking: Reported on 02/19/2024) 2 each 0   FIBER ADULT GUMMIES PO Take 2 each by mouth daily in the afternoon.     Theanine 200 MG CAPS Take 1 capsule by mouth daily.     venlafaxine XR (EFFEXOR-XR) 75 MG 24 hr capsule Take 1 capsule (75 mg total) by mouth daily. 30 capsule 1   No current facility-administered medications for this visit.    ROS: See above  Objective:  Psychiatric Specialty Exam: There were no vitals taken for this visit.There is no height or weight on file to calculate BMI.  General Appearance: Casual and Well Groomed  Eye Contact:  Good  Speech:  Clear and Coherent and Normal Rate  Volume:  Normal  Mood:  "good"  Affect:  Euthymic; engaged; social  Thought Content: Denies AVH; no overt delusional thought content on interview   Suicidal Thoughts:  No  Homicidal Thoughts:  No  Thought Process:  Goal Directed and Linear  Orientation:  Full (Time, Place, and Person)    Memory:  Grossly intact   Judgment:  Good  Insight:  Good  Concentration:  Concentration: Fair  Recall:  not  formally assessed   Fund of Knowledge: Good  Language: Good  Psychomotor Activity:  Normal  Akathisia:  No  AIMS (if indicated): NA  Assets:  Communication Skills Desire for Improvement Housing Leisure Time Physical Health Resilience Social Support Talents/Skills Transportation Vocational/Educational  ADL's:  Intact  Cognition: WNL  Sleep:  Good   PE: General: sits comfortably in view of camera; no acute distress  Pulm: no increased work of breathing on room air  MSK: all extremity movements appear intact  Neuro: no focal neurological deficits observed  Gait & Station: unable to assess by video    Metabolic Disorder Labs: Lab Results  Component Value Date   HGBA1C 4.8 05/18/2022   MPG 91 08/23/2017   MPG 94 10/28/2014   No results found for: "PROLACTIN" Lab Results  Component Value Date   CHOL 162 05/18/2022   TRIG 77 05/18/2022   HDL 48 05/18/2022   CHOLHDL 4 06/23/2021   VLDL 17.4 06/23/2021   LDLCALC 99 05/18/2022   LDLCALC 108 (H) 06/23/2021   Lab Results  Component Value Date   TSH 1.90 05/18/2022   TSH 3.37 11/02/2021    Therapeutic Level Labs: No results found for: "LITHIUM" No results found for: "VALPROATE" No results found for: "CBMZ"  Screenings:  GAD-7    Flowsheet Row Office Visit from 12/13/2023 in [facility] Counselor from 12/06/2023 in [facility] Office Visit from 06/23/2021 in [facility] [place]  Total GAD-7 Score 12 14 10       PHQ2-9    Flowsheet Row Office Visit from 12/13/2023 in [facility] Counselor from 12/06/2023 in [facility] Video Visit from 09/20/2021 in [facility] [place] Office Visit from 06/23/2021 in [facility] Visit from 06/06/2020 in [facility]  PHQ-2 Total Score 2 6 0 2 0  PHQ-9 Total  Score 12 17 -- 12 --      Flowsheet Row Office Visit from 12/13/2023 in [facility] Counselor from 12/06/2023 in [facility]  C-SSRS RISK CATEGORY No Risk No Risk       Collaboration of Care: Collaboration of Care: Medication Management AEB active medication management, Psychiatrist AEB established with this provider, and Referral or follow-up with counselor/therapist AEB established with individual psychotherapy  Patient/Guardian was advised Release of Information must be obtained prior to any record release in order to collaborate their care with an outside provider. Patient/Guardian was advised if they have not already done so to contact the registration department to sign all necessary forms in order for us  to release information regarding their care.   Consent: Patient/Guardian gives verbal consent for treatment and assignment of benefits for services provided during this visit. Patient/Guardian expressed understanding and agreed to proceed.   Televisit via video: I connected with patient on 02/19/24 at 10:00 AM EDT by a video enabled telemedicine application and verified that I am speaking with the correct person using two identifiers.  Location: Patient: home address in [place] Provider: remote office in [place]   I discussed the limitations of evaluation and management by telemedicine and the availability of in person appointments. The patient expressed understanding and agreed to proceed.  I discussed the assessment and treatment plan with the patient. The patient was provided an opportunity to ask  questions and all were answered. The patient agreed with the plan and demonstrated an understanding of the instructions.   The patient was advised to call back or seek an in-person evaluation if the symptoms worsen or if the condition fails to improve as anticipated.  I provided 90 minutes dedicated to the care of this patient via video on  the date of this encounter to include chart review, face-to-face time with the patient, medication management/counseling, documentation.  Jedd Schulenburg A Zade Falkner 02/19/2024, 4:25 PM

## 2024-02-19 ENCOUNTER — Telehealth (HOSPITAL_COMMUNITY): Admitting: Psychiatry

## 2024-02-19 ENCOUNTER — Encounter (HOSPITAL_COMMUNITY): Payer: Self-pay

## 2024-02-19 ENCOUNTER — Encounter (HOSPITAL_COMMUNITY): Payer: Self-pay | Admitting: Psychiatry

## 2024-02-19 DIAGNOSIS — Z5181 Encounter for therapeutic drug level monitoring: Secondary | ICD-10-CM | POA: Diagnosis not present

## 2024-02-19 DIAGNOSIS — F900 Attention-deficit hyperactivity disorder, predominantly inattentive type: Secondary | ICD-10-CM

## 2024-02-19 DIAGNOSIS — Z79899 Other long term (current) drug therapy: Secondary | ICD-10-CM | POA: Diagnosis not present

## 2024-02-19 DIAGNOSIS — F3341 Major depressive disorder, recurrent, in partial remission: Secondary | ICD-10-CM | POA: Diagnosis not present

## 2024-02-19 MED ORDER — AMPHETAMINE-DEXTROAMPHET ER 25 MG PO CP24: 25.0000 mg | ORAL_CAPSULE | ORAL | 0 refills | Status: DC

## 2024-02-19 MED ORDER — VENLAFAXINE HCL ER 75 MG PO CP24: 75.0000 mg | ORAL_CAPSULE | Freq: Every day | ORAL | 1 refills | Status: DC

## 2024-02-19 NOTE — Patient Instructions (Addendum)
 Thank you for attending your appointment today.  -- INCREASE Adderall XR to 25 mg daily -- RESTART Adderall IR 5 mg daily AS NEEDED for ADHD symptoms -- DECREASE trazodone  to 50 mg nightly for a few nights. If you do well with this, you can stop completely and only use as needed for insomnia. -- Continue other medications as prescribed. -- Please reach out to [facility] to have them send over a copy of your ADHD testing. Our fax is [phone]. Our clinic email is [email or web address]. -- I have sent a separate message to you with a copy of the lab order form to obtain a urine drug screen at your nearest [facility].   Please do not make any changes to medications without first discussing with your provider. If you are experiencing a psychiatric emergency, please call 911 or present to your nearest emergency department. Additional crisis, medication management, and therapy resources are included below.  [facility]  [address] [place], [place] [address] [phone] WALK-IN URGENT CARE 24/7 FOR ANYONE [address], [place], [place]  [phone] Fax: [phone] [email or web address] *Interpreters available *Accepts all insurance and uninsured for Urgent Care needs *Accepts Medicaid and uninsured for outpatient treatment (below)      ONLY FOR [facility]  Below:    Outpatient New Patient Assessment/Therapy Walk-ins:        Monday, Wednesday, and Thursday 8am until slots are full (first come, first served)                   New Patient Psychiatry/Medication Management        Monday-Friday 8am-11am (first come, first served)               For all walk-ins we ask that you arrive by 7:15am, because patients will be seen in the order of arrival.

## 2024-03-23 NOTE — Progress Notes (Addendum)
 BH MD Outpatient Progress Note  03/25/2024 12:19 PM Lindsey Avila  MRN:  [id number]  Assessment:  Lindsey Avila presents for follow-up evaluation. Today, 03/25/24, patient reports minimal benefit from increase in Adderall with continued inattention. Discussed that currently untreated sleep apnea may be worsening symptoms of ADHD and impacting efficacy of medications. She plans to follow-up with PCP regarding scheduling in-office sleep study. And would like to remain at current stimulant dosing while further workup is obtained. She does report mild worsening of depressive symptoms with decline in mood and decrease in motivation; identifying initial benefit from Effexor  for these symptoms that has since worn off. She is amenable to further increase in Effexor  at this time and reviewed importance of daily adherence.  RTC in 4 weeks by video.  Identifying Information: Lindsey Avila is a 48 y.o. female with a history of MDD, anxiety, ADHD inattentive type, OSA, and Vitamin D  deficiency who is an established patient with [facility].   Plan:  # ADHD inattentive type Supported by neuropsychiatric testing at [facility] June 2023 Past medication trials: Vyvanse , Wellbutrin  (allergic reaction), Qelbree, Strattera (blurred vision) Status of problem: new problem to this provider Interventions: -- Continue Adderall XR 25 mg daily -- Continue Adderall IR 5 mg daily PRN ADHD symptoms (patient has enough of current supply) -- Encouraged patient to pursue further evaluation and treatment for diagnosed moderate OSA (diagnosed on home sleep study) -- Continue individual psychotherapy with Bernice Rao [facility]  # MDD in partial remission Past medication trials: Lexapro  (irritable), Celexa (nightmares), naltrexone -bupropion  (allergic), Xanax , hydroxyzine Status of problem: new problem to this provider Interventions: -- INCREASE Effexor  to 150 mg daily (i7/9/25) --  RETURN to taking trazodone  100 mg nightly -- Encouraged patient to pursue treatment for diagnosed moderate OSA  # High risk medication use Interventions: Adderall: -- Vitals obtained at PCP appointment 02/18/24: BP 112/75 HR 76 -- EKG 06/20/20 NSR -- UDS ordered; patient denies use of any illicit substances - reminded today to obtain before next appointment  Patient was given contact information for behavioral health clinic and was instructed to call 911 for emergencies.   Subjective:  Chief Complaint:  Chief Complaint  Patient presents with   Medication Management    Interval History:   [name] reports she increased Adderall to 25 mg XR however has not noticed much impact; wonders if she may have preferred 20 mg XR and 5 mg IR due to quicker onset of IR. However, feels that even at this does focus and attention was not completely controlled.   Discussed how untreated OSA may be impacting attention and efficacy of stimulants. States she is open to further workup and is awaiting call back to schedule in-office study.  Reports adherence to Effexor ; reports mood is not down but not up either. Reports when initially started, found it more helpful for mood. Energy and motivation has been a bit lower. Denies passive/active SI.   Tried to wean off trazodone  but experienced nighttime awakening so has returned to 100 mg.  Reports concern for perimenopausal symptoms including occasional hot flashes, brain fog. However, notes this may be related to when she has (rarely) missed dose of Effexor .   Reports dry mouth with combination of Adderall and GLP1. Reports decreased appetite with these medications which has been positive however at times forgets to eat. Discussed option of setting alarms to remind her of mealtimes.   Did not do UDS as she lost paper and was traveling. Will plan to obtain  prior to next visit.   Amenable to further titration of Effexor  given uncontrolled mood symptoms while she  follows up regarding sleep study.   PDMP: -- Adderall ER 25 mg QTY 30 last filled 02/19/24 -- Adderall ER 20 mg QTY 30 last filled 01/13/24; rx dating back to Feb 2024 -- Adderall IR 5 mg tablet QTY 30 last filled 01/13/24; rx dating back to June 2023  Visit Diagnosis:    ICD-10-CM   1. MDD (major depressive disorder), recurrent, in partial remission (HCC)  F33.41     2. Attention deficit hyperactivity disorder (ADHD), predominantly inattentive type  F90.0      Past Psychiatric History:  Diagnoses: MDD; anxiety; ADHD inattentive type (supported by neuropsychiatric testing at [facility] June 2023) Medication trials: Vyvanse , Wellbutrin  (allergic reaction), Monette, Strattera (blurred vision); Lexapro  (irritable), Celexa (nightmares), naltrexone -bupropion  (allergic), Xanax , hydroxyzine, trazodone   Hospitalizations: denies Suicide attempts: denies SIB: denies Hx of violence towards others: denies Current access to guns: denies Substance use:  -- Etoh: rarely; once every few months -- Denies use of tobacco, or illicit drugs including cannabis  Past Medical History:  Past Medical History:  Diagnosis Date   ADHD (attention deficit hyperactivity disorder) 05/2020   Allergy    Anemia    in remote past due to heavy periods   Anxiety    Chronic back pain    Depression 2009   prior with pregnancy   Encounter for routine gynecological examination    Dr. Marjorie Gull   GERD (gastroesophageal reflux disease)    IUD (intrauterine device) in place    IUD replaced 08/13/2018 with Mirena ; Dr. Marjorie Gull   Obesity    Recurrent urinary tract infection    no prior urology consult   Seasonal allergies    Skin anomaly    keratinosis   Wears glasses     Past Surgical History:  Procedure Laterality Date   CESAREAN SECTION  2009   KNEE ARTHROSCOPY     left, arthroscopic    Family Psychiatric History:  Mother: depression Dad: alcohol use disorder Maternal  grandfather: alcohol use disorder Denies family history of sudden cardiac death or cardiac issues prior to 50 yo  Family History:  Family History  Problem Relation Age of Onset   Hypertension Mother    Hypothyroidism Mother    Obesity Mother    Macular degeneration Mother    Cataracts Mother    Cancer Mother        skin cancer   Asthma Mother    Depression Mother    Hypertension Father    Cancer Father        basal cell skin   Benign prostatic hyperplasia Father    Alcohol abuse Father    Obesity Maternal Uncle    Dementia Maternal Grandfather    Alcohol abuse Maternal Grandfather    COPD Maternal Grandmother    Asthma Maternal Grandmother    Heart disease Paternal Grandfather        MI   Hypertension Paternal Grandfather    Pancreatic cancer Paternal Grandmother    Cancer Paternal Grandmother        pancreatic   Stroke Neg Hx    Diabetes Neg Hx    Colon cancer Neg Hx    Esophageal cancer Neg Hx    Rectal cancer Neg Hx    Stomach cancer Neg Hx     Social History:  Academic/Vocational: 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;  works as Programmer, multimedia  director  Social History   Socioeconomic History   Marital status: Divorced    Spouse name: Not on file   Number of children: Not on file   Years of education: Not on file   Highest education level: Not on file  Occupational History   Not on file  Tobacco Use   Smoking status: Former    Current packs/day: 0.00    Types: Cigarettes    Quit date: 09/17/1998    Years since quitting: 25.5   Smokeless tobacco: Never   Tobacco comments:    Socially and infrequently  Vaping Use   Vaping status: Never Used  Substance and Sexual Activity   Alcohol use: Yes    Alcohol/week: 1.0 standard drink of alcohol    Types: 1 Glasses of wine per week    Comment: Very infrequent - once every few months   Drug use: No   Sexual activity: Not Currently    Birth control/protection: I.U.D.  Other Topics Concern   Not on  file  Social History Narrative   Lives with 12yo son, cat and dog.     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  [employer]vertising.    Exercise - kickboxing, but prior  with treadmill, cardio, circuit training.   05/2020   Social Drivers of Health   Financial Resource Strain: Medium Risk (12/06/2023)   Overall Financial Resource Strain (CARDIA)    Difficulty of Paying Living Expenses: Somewhat hard  Food Insecurity: No Food Insecurity (12/06/2023)   Hunger Vital Sign    Worried About Running Out of Food in the Last Year: Never true    Ran Out of Food in the Last Year: Never true  Transportation Needs: No Transportation Needs (12/06/2023)   PRAPARE - Administrator, Civil Service (Medical): No    Lack of Transportation (Non-Medical): No  Physical Activity: Inactive (12/06/2023)   Exercise Vital Sign    Days of Exercise per Week: 0 days    Minutes of Exercise per Session: 0 min  Stress: Stress Concern Present (12/06/2023)   [employer] of Occupational Health - Occupational Stress Questionnaire    Feeling of Stress : To some extent  Social Connections: Socially Isolated (12/06/2023)   Social Connection and Isolation Panel    Frequency of Communication with Friends and Family: More than three times a week    Frequency of Social Gatherings with Friends and Family: Three times a week    Attends Religious Services: Never    Active Member of Clubs or Organizations: No    Attends Banker Meetings: Never    Marital Status: Divorced    Allergies:  Allergies  Allergen Reactions   Sulfa Antibiotics Anaphylaxis and Hives   Bupropion  Hives   Contrave  [Naltrexone -Bupropion  Hcl Er] Hives   Latex    Penicillins     unknown    Current Medications: Current Outpatient Medications  Medication Sig Dispense Refill   amphetamine -dextroamphetamine  (ADDERALL XR) 25 MG 24 hr capsule Take 1 capsule by mouth every morning. 30 capsule 0    amphetamine -dextroamphetamine  (ADDERALL XR) 25 MG 24 hr capsule Take 1 capsule by mouth every morning. 30 capsule 0   Cholecalciferol 125 MCG (5000 UT) TABS Take 5,000 Units by mouth daily.     EPINEPHrine  0.3 mg/0.3 mL IJ SOAJ injection Inject 0.3 mg into the muscle as needed for anaphylaxis. (Patient not taking: Reported on 02/19/2024) 2 each 0   FIBER ADULT GUMMIES PO Take  2 each by mouth daily in the afternoon.     levonorgestrel  (MIRENA , 52 MG,) 20 MCG/24HR IUD Mirena  20 mcg/24 hours (6 yrs) 52 mg intrauterine device  Take 1 device by intrauterine route.     Theanine 200 MG CAPS Take 1 capsule by mouth daily.     traZODone  (DESYREL ) 50 MG tablet Take 2 tablets (100 mg total) by mouth at bedtime as needed. for sleep 30 tablet 2   venlafaxine  XR (EFFEXOR -XR) 150 MG 24 hr capsule Take 1 capsule (150 mg total) by mouth daily. 30 capsule 1   WEGOVY 1 MG/0.5ML SOAJ Inject 1 mg into the skin once a week.     No current facility-administered medications for this visit.    ROS: See above  Objective:  Psychiatric Specialty Exam: There were no vitals taken for this visit.There is no height or weight on file to calculate BMI.  General Appearance: Casual and Well Groomed  Eye Contact:  Good  Speech:  Clear and Coherent and Normal Rate  Volume:  Normal  Mood:  not bad but not good  Affect:  Euthymic; engaged; Tax inspector Content: Denies AVH; no overt delusional thought content on interview   Suicidal Thoughts:  No  Homicidal Thoughts:  No  Thought Process:  Goal Directed and Linear  Orientation:  Full (Time, Place, and Person)    Memory:  Grossly intact   Judgment:  Good  Insight:  Good  Concentration:  Concentration: Fair  Recall:  not formally assessed   Fund of Knowledge: Good  Language: Good  Psychomotor Activity:  Normal  Akathisia:  No  AIMS (if indicated): NA  Assets:  Communication Skills Desire for Improvement Housing Leisure Time Physical Health Resilience Social  Support Talents/Skills Transportation Vocational/Educational  ADL's:  Intact  Cognition: WNL  Sleep:  Good   PE: General: sits comfortably in view of camera; no acute distress  Pulm: no increased work of breathing on room air  MSK: all extremity movements appear intact  Neuro: no focal neurological deficits observed  Gait & Station: unable to assess by video    Metabolic Disorder Labs: Lab Results  Component Value Date   HGBA1C 4.8 05/18/2022   MPG 91 08/23/2017   MPG 94 10/28/2014   No results found for: PROLACTIN Lab Results  Component Value Date   CHOL 162 05/18/2022   TRIG 77 05/18/2022   HDL 48 05/18/2022   CHOLHDL 4 06/23/2021   VLDL 17.4 06/23/2021   LDLCALC 99 05/18/2022   LDLCALC 108 (H) 06/23/2021   Lab Results  Component Value Date   TSH 1.90 05/18/2022   TSH 3.37 11/02/2021    Therapeutic Level Labs: No results found for: LITHIUM No results found for: VALPROATE No results found for: CBMZ  Screenings:  GAD-7    Flowsheet Row Office Visit from 12/13/2023 in [facility] Counselor from 12/06/2023 in [facility] Office Visit from 06/23/2021 in [facility] [place]  Total GAD-7 Score 12 14 10    PHQ2-9    Flowsheet Row Office Visit from 12/13/2023 in [facility] Counselor from 12/06/2023 in [facility] Video Visit from 09/20/2021 in [facility] [place] Office Visit from 06/23/2021 in [facility] [employer] Visit from 06/06/2020 in [facility]  PHQ-2 Total Score 2 6 0 2 0  PHQ-9 Total Score 12 17 -- 12 --  Flowsheet Row Office Visit from 12/13/2023 in [facility] Counselor from 12/06/2023 in [facility]  C-SSRS RISK CATEGORY No Risk No Risk    Review of neuropsychological testing  from [facility] 02/22/22: Per report summary: Neuropsychiatric questionnaire shows severe problems with attention and memory. Objective testing with Qb results showed statistically significant abnormalities in attention and activity.   Collaboration of Care: Collaboration of Care: Medication Management AEB active medication management, Psychiatrist AEB established with this provider, and Referral or follow-up with counselor/therapist AEB established with individual psychotherapy  Patient/Guardian was advised Release of Information must be obtained prior to any record release in order to collaborate their care with an outside provider. Patient/Guardian was advised if they have not already done so to contact the registration department to sign all necessary forms in order for us  to release information regarding their care.   Consent: Patient/Guardian gives verbal consent for treatment and assignment of benefits for services provided during this visit. Patient/Guardian expressed understanding and agreed to proceed.   Televisit via video: I connected with patient on 03/25/24 at 11:30 AM EDT by a video enabled telemedicine application and verified that I am speaking with the correct person using two identifiers.  Location: Patient: home address in [place] Provider: remote office in [place]   I discussed the limitations of evaluation and management by telemedicine and the availability of in person appointments. The patient expressed understanding and agreed to proceed.  I discussed the assessment and treatment plan with the patient. The patient was provided an opportunity to ask questions and all were answered. The patient agreed with the plan and demonstrated an understanding of the instructions.   The patient was advised to call back or seek an in-person evaluation if the symptoms worsen or if the condition fails to improve as anticipated.  I provided 35 minutes dedicated to the care of  this patient via video on the date of this encounter to include chart review, face-to-face time with the patient, medication management/counseling, documentation, brief supportive psychotherapy.  Kaleab Frasier A Nikka Hakimian 03/25/2024, 12:19 PM

## 2024-03-25 ENCOUNTER — Telehealth (HOSPITAL_COMMUNITY): Admitting: Psychiatry

## 2024-03-25 ENCOUNTER — Encounter (HOSPITAL_COMMUNITY): Payer: Self-pay | Admitting: Psychiatry

## 2024-03-25 DIAGNOSIS — F900 Attention-deficit hyperactivity disorder, predominantly inattentive type: Secondary | ICD-10-CM | POA: Diagnosis not present

## 2024-03-25 DIAGNOSIS — F3341 Major depressive disorder, recurrent, in partial remission: Secondary | ICD-10-CM

## 2024-03-25 MED ORDER — VENLAFAXINE HCL ER 150 MG PO CP24: 150.0000 mg | ORAL_CAPSULE | Freq: Every day | ORAL | 1 refills | Status: DC

## 2024-03-25 MED ORDER — TRAZODONE HCL 50 MG PO TABS: 100.0000 mg | ORAL_TABLET | Freq: Every evening | ORAL | 2 refills | Status: DC | PRN

## 2024-03-25 NOTE — Patient Instructions (Signed)
 Thank you for attending your appointment today.  -- INCREASE venlafaxine  to 150 mg daily -- Continue other medications as prescribed. -- Please follow up with your PCP regarding sleep study.  Please do not make any changes to medications without first discussing with your provider. If you are experiencing a psychiatric emergency, please call 911 or present to your nearest emergency department. Additional crisis, medication management, and therapy resources are included below.  [facility]  [address], [place], [place] [address] [phone] WALK-IN URGENT CARE 24/7 FOR ANYONE [address] [place], [place]  [phone] Fax: [phone] [email or web address] *Interpreters available *Accepts all insurance and uninsured for Urgent Care needs *Accepts Medicaid and uninsured for outpatient treatment (below)      ONLY FOR [facility]  Below:    Outpatient New Patient Assessment/Therapy Walk-ins:        Monday, Wednesday, and Thursday 8am until slots are full (first come, first served)                   New Patient Psychiatry/Medication Management        Monday-Friday 8am-11am (first come, first served)               For all walk-ins we ask that you arrive by 7:15am, because patients will be seen in the order of arrival.

## 2024-03-30 ENCOUNTER — Ambulatory Visit (INDEPENDENT_AMBULATORY_CARE_PROVIDER_SITE_OTHER): Admitting: Mental Health

## 2024-03-30 DIAGNOSIS — F32A Depression, unspecified: Secondary | ICD-10-CM

## 2024-03-30 DIAGNOSIS — F900 Attention-deficit hyperactivity disorder, predominantly inattentive type: Secondary | ICD-10-CM

## 2024-03-30 NOTE — Progress Notes (Unsigned)
   THERAPIST PROGRESS NOTE Virtual Visit via Video Note  I connected with Lindsey Avila on 04/01/24 at  3:00 PM EDT by a video enabled telemedicine application and verified that I am speaking with the correct person using two identifiers.  Location: Patient: home address [place] Provider: home office    I discussed the limitations of evaluation and management by telemedicine and the availability of in person appointments. The patient expressed understanding and agreed to proceed.  I discussed the assessment and treatment plan with the patient. The patient was provided an opportunity to ask questions and all were answered. The patient agreed with the plan and demonstrated an understanding of the instructions.   The patient was advised to call back or seek an in-person evaluation if the symptoms worsen or if the condition fails to improve as anticipated.  I provided 50 minutes of non-face-to-face time during this encounter.   Ty Bernice Savant, [facility]   Session Time: 3:04 pm( 50 minutes)  Participation Level: Active  Behavioral Response: CasualAlertWNL  Type of Therapy: Individual Therapy  Treatment Goals addressed: STG: Not motivated, focus. Lindsey Avila will increase attention and focus with AEB exploration and implementation of daily routine with development of x 3 skills to increase focus within the next 90 days.   ProgressTowards Goals: Progressing  Interventions: CBT and Supportive  Summary: Lindsey Avila is a 48 y.o. female who presents with dx of mild depression and ADHD. Presents with chief complaint of doing good, mood meh; still low motivation Notes to have done well but had a series of out of town trips in which upon returning having difficult time readjusting. Shares difficulty getting tasks done and will procrastinate. Explores with therapist barriers of completing tasks and setting schedule. Notes also over researching and over thinking over need to purchase items.  Notes planned sleep study as providers are concerned about quality of sleep. Explores with therapist behavioral changes to support in increase in attendance to needed tasks. Agrees to working to set schedule for day and transition of activities with alarm on phone but utilizing watch, putting phone in designated area during time of production to reduce doom scrolling Denies safety concerns. Ongoing work towards goals.   Suicidal/Homicidal: Nowithout intent/plan  Therapist Response: Therapist engaged [place] in [employer]. Provided safe space to share concerns and current symptomology. Assessed for current level of functioning and sxs management. Engaged in reviewing previously discussed behavioral changes and behavioral tailoring to support in increase level of day to day functioning. Explored barriers in place and factors contributing to lack of consistency. Discussed role phone plays in production and procrastination. Encouraged leaving phone out of sight and not easily accessed. Explored setting deadlines into various choices. Encouraged ongoing medication compliance. Reviewed session and provided follow up   Plan: Return again in  weeks.  Diagnosis: Mild depression  Attention deficit hyperactivity disorder (ADHD), predominantly inattentive type  Collaboration of Care: Other None  Patient/Guardian was advised Release of Information must be obtained prior to any record release in order to collaborate their care with an outside provider. Patient/Guardian was advised if they have not already done so to contact the registration department to sign all necessary forms in order for us  to release information regarding their care.   Consent: Patient/Guardian gives verbal consent for treatment and assignment of benefits for services provided during this visit. Patient/Guardian expressed understanding and agreed to proceed.   Ty Bernice [place], [facility] 04/01/2024

## 2024-04-09 ENCOUNTER — Other Ambulatory Visit (HOSPITAL_COMMUNITY): Payer: Self-pay | Admitting: Psychiatry

## 2024-04-12 NOTE — Progress Notes (Deleted)
 04/13/24- 47 yof referred for sleep evaluation courtesy of Meagan Laberge, PA-C. Epworth score- Body weight today- Anxiety/ Depression Past Medical History:  Diagnosis Date   ADHD (attention deficit hyperactivity disorder) 05/2020   Allergy    Anemia    in remote past due to heavy periods   Anxiety    Chronic back pain    Depression 2009   prior with pregnancy   Encounter for routine gynecological examination    Dr. Marjorie Gull   GERD (gastroesophageal reflux disease)    IUD (intrauterine device) in place    IUD replaced 08/13/2018 with Mirena ; Dr. Marjorie Gull   Obesity    Recurrent urinary tract infection    no prior urology consult   Seasonal allergies    Skin anomaly    keratinosis   Wears glasses

## 2024-04-13 ENCOUNTER — Encounter: Payer: Self-pay | Admitting: Internal Medicine

## 2024-04-13 ENCOUNTER — Ambulatory Visit: Admitting: Internal Medicine

## 2024-04-23 NOTE — Progress Notes (Unsigned)
 BH MD Outpatient Progress Note  04/24/2024 12:38 PM Lindsey Avila  MRN:  [id number]  Assessment:  Lindsey Avila presents for follow-up evaluation. Today, 04/24/24, patient reports improvement in mood with increase in Effexor  and is tolerating well. She reflects that while Effexor  has been notably beneficial, she is unsure regarding impact from Adderall and identifies bothersome side effect of dry mouth (despite adequate hydration). Given role that untreated anxiety, depression, and sleep apnea may be playing on inattention, she would like to pursue holiday from Adderall and monitor symptoms of ADHD. Recommended that if there is resurgence of ADHD symptoms upon stopping XR formulation, she may trial starting Adderall IR 5 mg (already has this on hand) to assess tolerability of IR formulation. Should she ultimately decide to restart XR formulation, she has script awaiting pickup at pharmacy however reviewed that she will need to obtain UDS before further scripts will be provided beyond this. She plans to reschedule missed appointment with sleep medicine.   RTC in 2.5 months by video as patient will be traveling out of the country.   Identifying Information: Lindsey Avila is a 48 y.o. female with a history of MDD, anxiety, ADHD inattentive type, OSA, and Vitamin D  deficiency who is an established patient with [facility].   Plan:  # ADHD inattentive type Supported by neuropsychiatric testing at [facility] June 2023 Past medication trials: Vyvanse , Wellbutrin  (allergic reaction), Qelbree, Strattera (blurred vision) Status of problem: stable Interventions: -- STOP Adderall XR 25 mg daily (patient to take drug holiday to ascertain benefits/side effects from medication)  -- Patient has script for Adderall XR 25 mg ready at pharmacy should she decide to restart however discussed that she will need to obtain updated UDS before further scripts will be provided. --  Continue Adderall IR 5 mg daily PRN ADHD symptoms (not currently using - encouraged to trial if ADHD symptoms recur upon stopping XR) -- Encouraged patient to pursue further evaluation and treatment for diagnosed moderate OSA (diagnosed on home sleep study) -- Continue individual psychotherapy with [facility]  # MDD in partial remission Past medication trials: Lexapro  (irritable), Celexa (nightmares), naltrexone -bupropion  (allergic), Xanax , hydroxyzine Status of problem: improving Interventions: -- Continue Effexor  150 mg daily (i7/9/25) -- Continue trazodone  100 mg nightly -- Encouraged patient to pursue treatment for diagnosed moderate OSA  # High risk medication use Interventions: Adderall: -- Vitals obtained at PCP appointment 02/18/24: BP 112/75 HR 76 -- EKG 06/20/20 NSR -- UDS ordered; patient denies use of any illicit substances - reminded today to obtain before next appointment  Patient was given contact information for behavioral health clinic and was instructed to call 911 for emergencies.   Subjective:  Chief Complaint:  Chief Complaint  Patient presents with   Medication Management    Interval History:   Patient reports she has done well with increase in Effexor ; continues to have trouble motivating but has found it helpful for overall mood, resiliency, juggling multiple tasks without substantial overwhelm. Denies any adverse effects. Denies SI.  Feels that majority of benefit is from Effexor  rather than Adderall. Has skipped days and doesn't notice substantial difference. Feels that Adderall leads to dehydration. Would like to try break from Adderall and see how she does just with Effexor  alone.   Reports she continues to have issues with sleep; waking up at 3AM. Had appt with sleep medicine scheduled but missed appt. Will reschedule today. Has tried using BreathRight strips and notices a difference.   Discussed that  if she experiences worsening of ADHD  symptoms upon stopping XR, may trial IR 5 mg tablet to assess impact and side effects. She has script for Adderall XR 25 mg at pharmacy should she decide to restart however discussed that she will need to obtain updated UDS before further scripts will be provided.   PDMP: -- Adderall ER 25 mg QTY 30 last filled 02/19/24 -- Adderall ER 20 mg QTY 30 last filled 01/13/24; rx dating back to Feb 2024 -- Adderall IR 5 mg tablet QTY 30 last filled 01/13/24; rx dating back to June 2023  Visit Diagnosis:    ICD-10-CM   1. MDD (major depressive disorder), recurrent, in partial remission (HCC)  F33.41     2. Attention deficit hyperactivity disorder (ADHD), predominantly inattentive type  F90.0       Past Psychiatric History:  Diagnoses: MDD; anxiety; ADHD inattentive type (supported by neuropsychiatric testing at [facility] June 2023) Medication trials: Vyvanse , Wellbutrin  (allergic reaction), Qelbree, Strattera (blurred vision); Lexapro  (irritable), Celexa (nightmares), naltrexone -bupropion  (allergic), Xanax , hydroxyzine, trazodone   Hospitalizations: denies Suicide attempts: denies SIB: denies Hx of violence towards others: denies Current access to guns: denies Substance use:  -- Etoh: rarely; once every few months -- Denies use of tobacco, or illicit drugs including cannabis  Past Medical History:  Past Medical History:  Diagnosis Date   ADHD (attention deficit hyperactivity disorder) 05/2020   Allergy    Anemia    in remote past due to heavy periods   Anxiety    Chronic back pain    Depression 2009   prior with pregnancy   Encounter for routine gynecological examination    Dr. Marjorie Gull   GERD (gastroesophageal reflux disease)    IUD (intrauterine device) in place    IUD replaced 08/13/2018 with Mirena ; Dr. Marjorie Gull   Obesity    Recurrent urinary tract infection    no prior urology consult   Seasonal allergies    Skin anomaly    keratinosis   Wears  glasses     Past Surgical History:  Procedure Laterality Date   CESAREAN SECTION  2009   KNEE ARTHROSCOPY     left, arthroscopic    Family Psychiatric History:  Mother: depression Dad: alcohol use disorder Maternal grandfather: alcohol use disorder Denies family history of sudden cardiac death or cardiac issues prior to 50 yo  Family History:  Family History  Problem Relation Age of Onset   Hypertension Mother    Hypothyroidism Mother    Obesity Mother    Macular degeneration Mother    Cataracts Mother    Cancer Mother        skin cancer   Asthma Mother    Depression Mother    Hypertension Father    Cancer Father        basal cell skin   Benign prostatic hyperplasia Father    Alcohol abuse Father    Obesity Maternal Uncle    Dementia Maternal Grandfather    Alcohol abuse Maternal Grandfather    COPD Maternal Grandmother    Asthma Maternal Grandmother    Heart disease Paternal Grandfather        MI   Hypertension Paternal Grandfather    Pancreatic cancer Paternal Grandmother    Cancer Paternal Grandmother        pancreatic   Stroke Neg Hx    Diabetes Neg Hx    Colon cancer Neg Hx    Esophageal cancer Neg Hx    Rectal  cancer Neg Hx    Stomach cancer Neg Hx     Social History:  Academic/Vocational: Has BS in Geologist, engineering and communication degree;  works as Best boy  Social History   Socioeconomic History   Marital status: Divorced    Spouse name: Not on file   Number of children: Not on file   Years of education: Not on file   Highest education level: Not on file  Occupational History   Not on file  Tobacco Use   Smoking status: Former    Current packs/day: 0.00    Types: Cigarettes    Quit date: 09/17/1998    Years since quitting: 25.6   Smokeless tobacco: Never   Tobacco comments:    Socially and infrequently  Vaping Use   Vaping status: Never Used  Substance and Sexual Activity   Alcohol use: Yes    Alcohol/week: 1.0 standard  drink of alcohol    Types: 1 Glasses of wine per week    Comment: Very infrequent - once every few months   Drug use: No   Sexual activity: Not Currently    Birth control/protection: I.U.D.  Other Topics Concern   Not on file  Social History Narrative   Lives with 12yo son, cat and dog.     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  [employer]vertising.    Exercise - kickboxing, but prior  with treadmill, cardio, circuit training.   05/2020   Social Drivers of Health   Financial Resource Strain: Medium Risk (12/06/2023)   Overall Financial Resource Strain (CARDIA)    Difficulty of Paying Living Expenses: Somewhat hard  Food Insecurity: No Food Insecurity (12/06/2023)   Hunger Vital Sign    Worried About Running Out of Food in the Last Year: Never true    Ran Out of Food in the Last Year: Never true  Transportation Needs: No Transportation Needs (12/06/2023)   PRAPARE - Administrator, Civil Service (Medical): No    Lack of Transportation (Non-Medical): No  Physical Activity: Inactive (12/06/2023)   Exercise Vital Sign    Days of Exercise per Week: 0 days    Minutes of Exercise per Session: 0 min  Stress: Stress Concern Present (12/06/2023)   [employer] of Occupational Health - Occupational Stress Questionnaire    Feeling of Stress : To some extent  Social Connections: Socially Isolated (12/06/2023)   Social Connection and Isolation Panel    Frequency of Communication with Friends and Family: More than three times a week    Frequency of Social Gatherings with Friends and Family: Three times a week    Attends Religious Services: Never    Active Member of Clubs or Organizations: No    Attends Banker Meetings: Never    Marital Status: Divorced    Allergies:  Allergies  Allergen Reactions   Sulfa Antibiotics Anaphylaxis and Hives   Bupropion  Hives   Contrave  [Naltrexone -Bupropion  Hcl Er] Hives   Latex    Penicillins     unknown     Current Medications: Current Outpatient Medications  Medication Sig Dispense Refill   amphetamine -dextroamphetamine  (ADDERALL XR) 25 MG 24 hr capsule Take 1 capsule by mouth every morning. 30 capsule 0   traZODone  (DESYREL ) 50 MG tablet TAKE 2 TABLETS (100 MG TOTAL) BY MOUTH AT BEDTIME AS NEEDED. FOR SLEEP 180 tablet 0   venlafaxine  XR (EFFEXOR -XR) 150 MG 24 hr capsule Take 1 capsule (150 mg total)  by mouth daily. 30 capsule 1   amphetamine -dextroamphetamine  (ADDERALL XR) 25 MG 24 hr capsule Take 1 capsule by mouth every morning. 30 capsule 0   Cholecalciferol 125 MCG (5000 UT) TABS Take 5,000 Units by mouth daily.     EPINEPHrine  0.3 mg/0.3 mL IJ SOAJ injection Inject 0.3 mg into the muscle as needed for anaphylaxis. (Patient not taking: Reported on 02/19/2024) 2 each 0   FIBER ADULT GUMMIES PO Take 2 each by mouth daily in the afternoon.     levonorgestrel  (MIRENA , 52 MG,) 20 MCG/24HR IUD Mirena  20 mcg/24 hours (6 yrs) 52 mg intrauterine device  Take 1 device by intrauterine route.     Theanine 200 MG CAPS Take 1 capsule by mouth daily.     WEGOVY 1 MG/0.5ML SOAJ Inject 1 mg into the skin once a week.     No current facility-administered medications for this visit.    ROS: See above  Objective:  Psychiatric Specialty Exam: There were no vitals taken for this visit.There is no height or weight on file to calculate BMI.  General Appearance: Casual and Well Groomed  Eye Contact:  Good  Speech:  Clear and Coherent and Normal Rate  Volume:  Normal  Mood:  better  Affect:  Euthymic; engaged; social  Thought Content: Denies AVH; no overt delusional thought content on interview   Suicidal Thoughts:  No  Homicidal Thoughts:  No  Thought Process:  Goal Directed and Linear  Orientation:  Full (Time, Place, and Person)    Memory:  Grossly intact   Judgment:  Good  Insight:  Good  Concentration:  Concentration: Fair  Recall:  not formally assessed   Fund of Knowledge: Good   Language: Good  Psychomotor Activity:  Normal  Akathisia:  No  AIMS (if indicated): NA  Assets:  Communication Skills Desire for Improvement Housing Leisure Time Physical Health Resilience Social Support Talents/Skills Transportation Vocational/Educational  ADL's:  Intact  Cognition: WNL  Sleep:  Fair   PE: General: sits comfortably in view of camera; no acute distress  Pulm: no increased work of breathing on room air  MSK: all extremity movements appear intact  Neuro: no focal neurological deficits observed  Gait & Station: unable to assess by video    Metabolic Disorder Labs: Lab Results  Component Value Date   HGBA1C 4.8 05/18/2022   MPG 91 08/23/2017   MPG 94 10/28/2014   No results found for: PROLACTIN Lab Results  Component Value Date   CHOL 162 05/18/2022   TRIG 77 05/18/2022   HDL 48 05/18/2022   CHOLHDL 4 06/23/2021   VLDL 17.4 06/23/2021   LDLCALC 99 05/18/2022   LDLCALC 108 (H) 06/23/2021   Lab Results  Component Value Date   TSH 1.90 05/18/2022   TSH 3.37 11/02/2021    Therapeutic Level Labs: No results found for: LITHIUM No results found for: VALPROATE No results found for: CBMZ  Screenings:  GAD-7    Flowsheet Row Office Visit from 12/13/2023 in [facility] Counselor from 12/06/2023 in [facility] Office Visit from 06/23/2021 in [facility]  Total GAD-7 Score 12 14 10    [employer]    Flowsheet Row Office Visit from 12/13/2023 in [facility] Counselor from 12/06/2023 in [facility] Video Visit from 09/20/2021 in [facility] [place] Office Visit from 06/23/2021 in [facility] Office Visit from 06/06/2020 in [facility]  PHQ-2 Total Score 2 6 0 2 0  PHQ-9 Total Score 12 17 -- 12 --   Flowsheet Row Office Visit  from 12/13/2023 in [facility] Counselor from 12/06/2023 in [facility]  C-SSRS RISK CATEGORY No Risk No Risk    Review of neuropsychological testing from [facility] 02/22/22: Per report summary: Neuropsychiatric questionnaire shows severe problems with attention and memory. Objective testing with Qb results showed statistically significant abnormalities in attention and activity.   Collaboration of Care: Collaboration of Care: Medication Management AEB active medication management, Psychiatrist AEB established with this provider, and Referral or follow-up with counselor/therapist AEB established with individual psychotherapy  Patient/Guardian was advised Release of Information must be obtained prior to any record release in order to collaborate their care with an outside provider. Patient/Guardian was advised if they have not already done so to contact the registration department to sign all necessary forms in order for us  to release information regarding their care.   Consent: Patient/Guardian gives verbal consent for treatment and assignment of benefits for services provided during this visit. Patient/Guardian expressed understanding and agreed to proceed.   Televisit via video: I connected with patient on 04/24/24 at 11:30 AM EDT by a video enabled telemedicine application and verified that I am speaking with the correct person using two identifiers.  Location: Patient: home address in [place] Provider: remote office in [place]   I discussed the limitations of evaluation and management by telemedicine and the availability of in person appointments. The patient expressed understanding and agreed to proceed.  I discussed the assessment and treatment plan with the patient. The patient was provided an opportunity to ask questions and all were answered. The patient agreed with the plan and demonstrated an understanding of the  instructions.   The patient was advised to call back or seek an in-person evaluation if the symptoms worsen or if the condition fails to improve as anticipated.  I provided 30 minutes dedicated to the care of this patient via video on the date of this encounter to include chart review, face-to-face time with the patient, medication management/counseling, documentation, brief supportive psychotherapy.  Shanieka Blea A Velena Keegan 04/24/2024, 12:38 PM

## 2024-04-24 ENCOUNTER — Telehealth (HOSPITAL_COMMUNITY): Admitting: Psychiatry

## 2024-04-24 ENCOUNTER — Encounter (HOSPITAL_COMMUNITY): Payer: Self-pay | Admitting: Psychiatry

## 2024-04-24 ENCOUNTER — Other Ambulatory Visit (HOSPITAL_COMMUNITY): Payer: Self-pay | Admitting: Psychiatry

## 2024-04-24 ENCOUNTER — Encounter (HOSPITAL_COMMUNITY): Payer: Self-pay

## 2024-04-24 DIAGNOSIS — F3341 Major depressive disorder, recurrent, in partial remission: Secondary | ICD-10-CM

## 2024-04-24 DIAGNOSIS — Z79899 Other long term (current) drug therapy: Secondary | ICD-10-CM

## 2024-04-24 DIAGNOSIS — F900 Attention-deficit hyperactivity disorder, predominantly inattentive type: Secondary | ICD-10-CM | POA: Diagnosis not present

## 2024-04-24 MED ORDER — VENLAFAXINE HCL ER 150 MG PO CP24: 150.0000 mg | ORAL_CAPSULE | Freq: Every day | ORAL | 1 refills | Status: DC

## 2024-04-24 MED ORDER — TRAZODONE HCL 100 MG PO TABS: 50.0000 mg | ORAL_TABLET | Freq: Every evening | ORAL | 1 refills | Status: DC | PRN

## 2024-04-24 NOTE — Patient Instructions (Addendum)
 Thank you for attending your appointment today.  -- Take a break from Adderall XR and see how you do without it - if you notice ADHD symptoms return, try Adderall IR 5 mg first and note how this works for your symptoms/or if you experience any side effects. -- In order to continue stimulants moving forward, please obtain the urine drug screen at [facility]. -- Continue other medications as prescribed. -- If you have any questions, please reach out to the clinic at [phone].  Please do not make any changes to medications without first discussing with your provider. If you are experiencing a psychiatric emergency, please call 911 or present to your nearest emergency department. Additional crisis, medication management, and therapy resources are included below.  [facility]  [address], [place], [place] [address] [phone] WALK-IN URGENT CARE 24/7 FOR ANYONE [address], [place], [place]  [phone] Fax: [phone] [email or web address] *Interpreters available *Accepts all insurance and uninsured for Urgent Care needs *Accepts Medicaid and uninsured for outpatient treatment (below)      ONLY FOR [facility]  Below:    Outpatient New Patient Assessment/Therapy Walk-ins:        Monday, Wednesday, and Thursday 8am until slots are full (first come, first served)                   New Patient Psychiatry/Medication Management        Monday-Friday 8am-11am (first come, first served)               For all walk-ins we ask that you arrive by 7:15am, because patients will be seen in the order of arrival.

## 2024-05-12 ENCOUNTER — Encounter: Payer: Self-pay | Admitting: Plastic Surgery

## 2024-05-12 ENCOUNTER — Ambulatory Visit (INDEPENDENT_AMBULATORY_CARE_PROVIDER_SITE_OTHER): Admitting: Plastic Surgery

## 2024-05-12 VITALS — BP 110/79 | HR 94 | Ht 66.0 in | Wt 183.2 lb

## 2024-05-12 DIAGNOSIS — N62 Hypertrophy of breast: Secondary | ICD-10-CM | POA: Insufficient documentation

## 2024-05-12 DIAGNOSIS — Z6829 Body mass index (BMI) 29.0-29.9, adult: Secondary | ICD-10-CM

## 2024-05-12 DIAGNOSIS — M542 Cervicalgia: Secondary | ICD-10-CM | POA: Insufficient documentation

## 2024-05-12 DIAGNOSIS — M549 Dorsalgia, unspecified: Secondary | ICD-10-CM | POA: Insufficient documentation

## 2024-05-12 DIAGNOSIS — M546 Pain in thoracic spine: Secondary | ICD-10-CM | POA: Diagnosis not present

## 2024-05-12 DIAGNOSIS — G8929 Other chronic pain: Secondary | ICD-10-CM

## 2024-05-12 DIAGNOSIS — F3341 Major depressive disorder, recurrent, in partial remission: Secondary | ICD-10-CM

## 2024-05-12 NOTE — Progress Notes (Signed)
 Patient ID: Lindsey Avila, female    DOB: 1976-06-17, 48 y.o.   MRN: [id number]   Chief Complaint  Patient presents with   Consult         Mammary Hyperplasia: The patient is a 48 y.o. female with a history of mammary hyperplasia for several years.  She has extremely large breasts causing symptoms that include the following: Back pain in the upper and lower back, including neck pain. She pulls or pins her bra straps to provide better lift and relief of the pressure and pain. She notices relief by holding her breast up manually.  Her shoulder straps cause grooves and pain and pressure that requires padding for relief. Pain medication is sometimes required with motrin and tylenol.  Activities that are hindered by enlarged breasts include: exercise and running.  She has tried supportive clothing as well as fitted bras without improvement.  Her breasts are extremely large and fairly symmetric but the right is longer.  She has hyperpigmentation of the inframammary area on both sides.  The sternal to nipple distance on the right is 34 cm and the left is 31 cm.  The IMF distance is 14 cm.  She is 5 feet 6 inches tall and weighs 183 pounds.  The BMI = 29.5 kg/m.  Preoperative bra size = G/H cup.  The estimated excess breast tissue to be removed at the time of surgery = 550-600 grams on the left and 550-600 grams on the right.  Mammogram history: 11/24 negative.  Family history of breast cancer:  none.  Tobacco use:  none.   The patient expresses the desire to pursue surgical intervention.     Review of Systems  Constitutional:  Positive for activity change. Negative for appetite change.  HENT: Negative.    Eyes: Negative.   Respiratory: Negative.    Cardiovascular: Negative.   Gastrointestinal: Negative.   Endocrine: Negative.   Genitourinary: Negative.   Musculoskeletal:  Positive for back pain and neck pain.  Skin:  Positive for rash.    Past Medical History:  Diagnosis Date   ADHD  (attention deficit hyperactivity disorder) 05/2020   Allergy    Anemia    in remote past due to heavy periods   Anxiety    Chronic back pain    Depression 2009   prior with pregnancy   Encounter for routine gynecological examination    Dr. Marjorie Gull   GERD (gastroesophageal reflux disease)    IUD (intrauterine device) in place    IUD replaced 08/13/2018 with Mirena ; Dr. Marjorie Gull   Obesity    Recurrent urinary tract infection    no prior urology consult   Seasonal allergies    Skin anomaly    keratinosis   Wears glasses     Past Surgical History:  Procedure Laterality Date   CESAREAN SECTION  2009   KNEE ARTHROSCOPY     left, arthroscopic      Current Outpatient Medications:    amphetamine -dextroamphetamine  (ADDERALL XR) 25 MG 24 hr capsule, Take 1 capsule by mouth every morning., Disp: 30 capsule, Rfl: 0   Cholecalciferol 125 MCG (5000 UT) TABS, Take 5,000 Units by mouth daily., Disp: , Rfl:    FIBER ADULT GUMMIES PO, Take 2 each by mouth daily in the afternoon., Disp: , Rfl:    levonorgestrel  (MIRENA , 52 MG,) 20 MCG/24HR IUD, Mirena  20 mcg/24 hours (6 yrs) 52 mg intrauterine device  Take 1 device by intrauterine route., Disp: , Rfl:  Theanine 200 MG CAPS, Take 1 capsule by mouth daily., Disp: , Rfl:    traZODone  (DESYREL ) 100 MG tablet, Take 0.5-1 tablets (50-100 mg total) by mouth at bedtime as needed for sleep., Disp: 90 tablet, Rfl: 1   venlafaxine  XR (EFFEXOR -XR) 150 MG 24 hr capsule, Take 1 capsule (150 mg total) by mouth daily., Disp: 90 capsule, Rfl: 1   WEGOVY 1 MG/0.5ML SOAJ, Inject 1 mg into the skin once a week., Disp: , Rfl:    amphetamine -dextroamphetamine  (ADDERALL XR) 25 MG 24 hr capsule, Take 1 capsule by mouth every morning., Disp: 30 capsule, Rfl: 0   EPINEPHrine  0.3 mg/0.3 mL IJ SOAJ injection, Inject 0.3 mg into the muscle as needed for anaphylaxis. (Patient not taking: Reported on 05/12/2024), Disp: 2 each, Rfl: 0   Objective:   Vitals:    05/12/24 1127  BP: 110/79  Pulse: 94  SpO2: 96%    Physical Exam Vitals reviewed.  Constitutional:      Appearance: Normal appearance.  HENT:     Head: Atraumatic.  Cardiovascular:     Rate and Rhythm: Normal rate.     Pulses: Normal pulses.  Pulmonary:     Effort: Pulmonary effort is normal.  Abdominal:     Palpations: Abdomen is soft.  Skin:    General: Skin is warm.     Capillary Refill: Capillary refill takes less than 2 seconds.  Neurological:     Mental Status: She is alert and oriented to person, place, and time.  Psychiatric:        Mood and Affect: Mood normal.        Behavior: Behavior normal.        Thought Content: Thought content normal.        Judgment: Judgment normal.     Assessment & Plan:  MDD (major depressive disorder), recurrent, in partial remission (HCC)  Chronic bilateral thoracic back pain  Symptomatic mammary hypertrophy  Neck pain  The procedure the patient selected and that was best for the patient was discussed. The risk were discussed and include but not limited to the following:  Breast asymmetry, fluid accumulation, firmness of the breast, inability to breast feed, loss of nipple or areola, skin loss, change in skin and nipple sensation, fat necrosis of the breast tissue, bleeding, infection and healing delay.  There are risks of anesthesia and injury to nerves or blood vessels.  Allergic reaction to tape, suture and skin glue are possible.  There will be swelling.  Any of these can lead to the need for revisional surgery which is not included in this surgery.  A breast reduction has potential to interfere with diagnostic procedures in the future.  This procedure is best done when the breast is fully developed.  Changes in the breast will continue to occur over time: pregnancy, weight gain or weigh loss. No guarantees are given for a certain bra or breast size.    Total time: 40 minutes. This includes time spent with the patient during the  visit as well as time spent before and after the visit reviewing the chart, documenting the encounter, ordering pertinent studies and literature for the patient.   Physical therapy: Not required Mammogram: Up-to-date as long as the surgery is done before November  The patient is a good candidate for bilateral breast reduction with liposuction.  She is aware of the risks and complications.  Pictures were obtained of the patient and placed in the chart with the patient's or guardian's permission.  Estefana RAMAN Samiha Denapoli, DO

## 2024-05-13 ENCOUNTER — Encounter: Payer: Self-pay | Admitting: Plastic Surgery

## 2024-05-27 ENCOUNTER — Ambulatory Visit (HOSPITAL_COMMUNITY): Admitting: Mental Health

## 2024-05-27 DIAGNOSIS — F32A Depression, unspecified: Secondary | ICD-10-CM | POA: Diagnosis not present

## 2024-05-27 DIAGNOSIS — F900 Attention-deficit hyperactivity disorder, predominantly inattentive type: Secondary | ICD-10-CM

## 2024-05-27 NOTE — Progress Notes (Unsigned)
   THERAPIST PROGRESS NOTE Virtual Visit via Video Note  I connected with Lindsey Avila on 05/27/24 at  2:00 PM EDT by a video enabled telemedicine application and verified that I am speaking with the correct person using two identifiers.  Location: Patient: home address on file Provider: office   I discussed the limitations of evaluation and management by telemedicine and the availability of in person appointments. The patient expressed understanding and agreed to proceed.  I discussed the assessment and treatment plan with the patient. The patient was provided an opportunity to ask questions and all were answered. The patient agreed with the plan and demonstrated an understanding of the instructions.   The patient was advised to call back or seek an in-person evaluation if the symptoms worsen or if the condition fails to improve as anticipated.  I provided 51 minutes of non-face-to-face time during this encounter.   Lindsey Avila, [facility]   Session Time: 2:05 am (   Participation Level: Active  Behavioral Response: CasualAlertWNL  Type of Therapy: Individual Therapy  Treatment Goals addressed:  STG: Not motivated, focus. Kadince will increase attention and focus with AEB exploration and implementation of daily routine with development of x 3 skills to increase focus within the next 90 days.   ProgressTowards Goals: Progressing  Interventions: Supportive  Summary:  Lindsey Avila is a 48 y.o. female who presents with dx of mild depression and ADHD. Presents with chief complaint of doing good, mood meh; still low motivation Notes to have done well but had a series of out of town trips in which upon returning having difficult time     Suicidal/Homicidal: Nowithout intent/plan  Therapist Response:  Therapist engaged [place] in [employer]. Provided safe space to share concerns and current symptomology. Assessed for current level of functioning and sxs management.  Engaged in reviewing previously discussed behavioral   Plan: Return again in x 4 weeks.  Diagnosis: Mild depression  Attention deficit hyperactivity disorder (ADHD), predominantly inattentive type  Collaboration of Care: Other None  Patient/Guardian was advised Release of Information must be obtained prior to any record release in order to collaborate their care with an outside provider. Patient/Guardian was advised if they have not already done so to contact the registration department to sign all necessary forms in order for us  to release information regarding their care.   Consent: Patient/Guardian gives verbal consent for treatment and assignment of benefits for services provided during this visit. Patient/Guardian expressed understanding and agreed to proceed.   Lindsey Avila, [facility] 05/27/2024

## 2024-06-08 ENCOUNTER — Ambulatory Visit: Payer: Self-pay

## 2024-06-16 ENCOUNTER — Ambulatory Visit: Admitting: Internal Medicine

## 2024-06-24 ENCOUNTER — Encounter (HOSPITAL_BASED_OUTPATIENT_CLINIC_OR_DEPARTMENT_OTHER): Payer: Self-pay

## 2024-06-24 ENCOUNTER — Ambulatory Visit (INDEPENDENT_AMBULATORY_CARE_PROVIDER_SITE_OTHER)

## 2024-06-24 VITALS — BP 113/75 | HR 81 | Ht 66.0 in | Wt 189.0 lb

## 2024-06-24 DIAGNOSIS — G4719 Other hypersomnia: Secondary | ICD-10-CM | POA: Diagnosis not present

## 2024-06-24 DIAGNOSIS — G4733 Obstructive sleep apnea (adult) (pediatric): Secondary | ICD-10-CM

## 2024-06-24 NOTE — Progress Notes (Signed)
 @Patient  ID: Lindsey Avila, female    DOB: 1976-07-14, 48 y.o.   MRN: [id number]  Chief Complaint  Patient presents with   Establish Care    New Sleep     Referring provider: Job Lukes, PA  HPI: Lindsey Avila is a 48 y/o female with PMH of ADHD, GERD, seasonal allergies, and elevated BMI who presents as a new patient for evaluation of sleep.  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  She was started on Wegovy for weight loss but declined CPAP treatment at that time.  She reports that she continues to have issues with excessive daytime sleepiness and brain fog.  It affects her daily activities.  She has also had some issues with snoring.  She goes to bed between 11 and 12 PM at night and gets up at 9 AM.  She reports her weight has fluctuated up and down by about 5 to 10 pounds over the last couple of years.  She denies history of high blood pressure, heart attack, stroke, or heart rhythm problems.  She admits to rare alcohol intake.  Review of systems is positive for shortness of breath with activity, acid reflux, nasal congestion, anxiety, depression.  She did also complain of recent sore throat and loss of appetite but also states that she had COVID last week.  TEST/EVENTS : Patient self-reports a positive sleep study demonstrating mild to moderate sleep apnea.  This is not available for review.  Epworth sleepiness scale of 9 today.  Allergies  Allergen Reactions   Sulfa Antibiotics Anaphylaxis and Hives   Bupropion  Hives   Contrave  [Naltrexone -Bupropion  Hcl Er] Hives   Latex    Penicillins     unknown    Immunization History  Administered Date(s) Administered   Influenza Split 06/21/2010, 06/22/2015   Influenza,inj,Quad PF,6+ Mos 08/23/2017, 07/08/2019, 06/06/2020   Influenza-Unspecified 07/18/2017, 06/09/2021, 06/11/2022   Janssen (J&J) SARS-COV-2 Vaccination 11/29/2019   Moderna Covid-19  Vaccine Bivalent Booster 53yrs & up 06/09/2021   Moderna SARS-COV2 Booster Vaccination 07/23/2020   Moderna Sars-Covid-2 Vaccination 07/23/2020   Pfizer(Comirnaty)Fall Seasonal Vaccine 12 years and older 06/19/2022, 05/17/2023   Tdap 08/23/2017    Past Medical History:  Diagnosis Date   ADHD (attention deficit hyperactivity disorder) 05/2020   Allergy    Anemia    in remote past due to heavy periods   Anxiety    Chronic back pain    Depression 2009   prior with pregnancy   Encounter for routine gynecological examination    Dr. Marjorie Gull   GERD (gastroesophageal reflux disease)    IUD (intrauterine device) in place    IUD replaced 08/13/2018 with Mirena ; Dr. Marjorie Gull   Obesity    Recurrent urinary tract infection    no prior urology consult   Seasonal allergies    Skin anomaly    keratinosis   Wears glasses     Tobacco History: Social History   Tobacco Use  Smoking Status Former   Current packs/day: 0.00   Types: Cigarettes   Quit date: 09/17/1998   Years since quitting: 25.7  Smokeless Tobacco Never  Tobacco Comments   Socially and infrequently   Counseling given: Not Answered Tobacco comments: Socially and infrequently   Outpatient Medications Prior to Visit  Medication Sig Dispense Refill   amphetamine -dextroamphetamine  (ADDERALL XR) 25 MG 24 hr capsule Take 1 capsule by mouth every morning.  30 capsule 0   Cholecalciferol 125 MCG (5000 UT) TABS Take 5,000 Units by mouth daily.     EPINEPHrine  0.3 mg/0.3 mL IJ SOAJ injection Inject 0.3 mg into the muscle as needed for anaphylaxis. 2 each 0   FIBER ADULT GUMMIES PO Take 2 each by mouth daily in the afternoon.     levonorgestrel  (MIRENA , 52 MG,) 20 MCG/24HR IUD Mirena  20 mcg/24 hours (6 yrs) 52 mg intrauterine device  Take 1 device by intrauterine route.     Theanine 200 MG CAPS Take 1 capsule by mouth daily.     traZODone  (DESYREL ) 100 MG tablet Take 0.5-1 tablets (50-100 mg total) by mouth at bedtime as  needed for sleep. 90 tablet 1   venlafaxine  XR (EFFEXOR -XR) 150 MG 24 hr capsule Take 1 capsule (150 mg total) by mouth daily. 90 capsule 1   WEGOVY 1 MG/0.5ML SOAJ Inject 1 mg into the skin once a week.     amphetamine -dextroamphetamine  (ADDERALL XR) 25 MG 24 hr capsule Take 1 capsule by mouth every morning. 30 capsule 0   No facility-administered medications prior to visit.     Review of Systems: Positive as mentioned in HPI  Constitutional:   No  weight loss, night sweats,  Fevers, chills, fatigue, or  lassitude.  HEENT:   No headaches,  Difficulty swallowing,  Tooth/dental problems, or  Sore throat,                No sneezing, itching, ear ache, nasal congestion, post nasal drip,   CV:  No chest pain,  Orthopnea, PND, swelling in lower extremities, anasarca, dizziness, palpitations, syncope.   GI  No heartburn, indigestion, abdominal pain, nausea, vomiting, diarrhea, change in bowel habits, loss of appetite, bloody stools.   Resp: No shortness of breath with exertion or at rest.  No excess mucus, no productive cough,  No non-productive cough,  No coughing up of blood.  No change in color of mucus.  No wheezing.  No chest wall deformity  Skin: no rash or lesions.  GU: no dysuria, change in color of urine, no urgency or frequency.  No flank pain, no hematuria   MS:  No joint pain or swelling.  No decreased range of motion.  No back pain.    Physical Exam  BP 113/75   Pulse 81   Ht 5' 6 (1.676 m)   Wt 189 lb (85.7 kg)   SpO2 98%   BMI 30.51 kg/m   GEN: A/Ox3; pleasant , NAD, well nourished    HEENT:  [place]/AT,  EACs-clear, TMs-wnl, NOSE-clear, THROAT-clear, no lesions, no postnasal drip or exudate noted.  Mallampati 2  NECK:  Supple w/ fair ROM; no JVD; normal carotid impulses w/o bruits; no thyromegaly or nodules palpated; no lymphadenopathy.    RESP  Clear  P & A; w/o, wheezes/ rales/ or rhonchi. no accessory muscle use, no dullness to percussion  CARD:  RRR, no  m/r/g, no peripheral edema, pulses intact, no cyanosis or clubbing.  GI:   Soft & nt; nml bowel sounds; no organomegaly or masses detected.   Musco: Warm bil, no deformities or joint swelling noted.   Neuro: alert, no focal deficits noted.    Skin: Warm, no lesions or rashes    Lab Results:  CBC    Component Value Date/Time   WBC 8.7 05/18/2022 0000   WBC 7.0 11/02/2021 0944   RBC 4.89 05/17/2022 0000   HGB 13.8 05/18/2022 0000   HGB 14.9 06/06/2020  1441   HCT 41 05/18/2022 0000   HCT 43.8 06/06/2020 1441   PLT 260 05/18/2022 0000   PLT 237 06/06/2020 1441   MCV 84.1 11/02/2021 0944   MCV 85 06/06/2020 1441   MCH 28.8 06/06/2020 1441   MCH 28.5 08/23/2017 1108   MCHC 33.8 11/02/2021 0944   RDW 14.0 11/02/2021 0944   RDW 12.6 06/06/2020 1441   LYMPHSABS 1.6 11/02/2021 0944   MONOABS 0.4 11/02/2021 0944   EOSABS 0.2 11/02/2021 0944   BASOSABS 0.0 11/02/2021 0944    BMET    Component Value Date/Time   NA 140 05/17/2022 0000   K 4.2 05/17/2022 0000   CL 101 05/17/2022 0000   CO2 25 (A) 05/17/2022 0000   GLUCOSE 80 11/02/2021 0944   BUN 10 05/17/2022 0000   CREATININE 1.0 05/17/2022 0000   CREATININE 0.80 11/02/2021 0944   CREATININE 0.71 08/23/2017 1108   CALCIUM 9.4 05/18/2022 0000   GFRNONAA 81 06/06/2020 1441   GFRAA 94 06/06/2020 1441    BNP No results found for: BNP  ProBNP No results found for: PROBNP  Imaging: No results found.  Administration History     None           No data to display          No results found for: NITRICOXIDE   Assessment & Plan:  Lindsey Avila is a 48 y/o female with PMH of ADHD, GERD, seasonal allergies, and elevated BMI who presents as a new patient for evaluation of sleep.  Her complaints of snoring, brain fog, excessive daytime sleepiness, and elevated Epworth at 9 are all concerning for sleep apnea especially in the setting of prior positive sleep test. Assessment & Plan Excessive daytime  sleepiness Plan for home sleep test to reevaluate for OSA and degree. Sleep hygiene discussed with patient today. Plan for follow-up after sleep test completed to review results. Options to treat sleep apnea also discussed with her today which include but are not limited to: Orthodontic device, ENT evaluation, CPAP, or inspire device.   Return in about 10 weeks (around 09/02/2024) for sleep study review.  Candis Dandy, PA-C 06/24/2024

## 2024-06-24 NOTE — Telephone Encounter (Signed)
 Here you go

## 2024-06-24 NOTE — Patient Instructions (Signed)
 Complete home sleep test as ordered.  Follow up in 10 weeks for results review.

## 2024-06-30 NOTE — Telephone Encounter (Signed)
 This is consistent with mild OSA.  I can put an order in for a new CPAP- will just have to fax the test results to her DME company.

## 2024-06-30 NOTE — Telephone Encounter (Signed)
 Pt wants to know if this is sufficient

## 2024-07-01 ENCOUNTER — Other Ambulatory Visit: Payer: Self-pay

## 2024-07-01 ENCOUNTER — Ambulatory Visit: Attending: Orthopedic Surgery | Admitting: Rehabilitative and Restorative Service Providers"

## 2024-07-01 ENCOUNTER — Encounter: Payer: Self-pay | Admitting: Rehabilitative and Restorative Service Providers"

## 2024-07-01 DIAGNOSIS — M6281 Muscle weakness (generalized): Secondary | ICD-10-CM | POA: Diagnosis present

## 2024-07-01 DIAGNOSIS — M25561 Pain in right knee: Secondary | ICD-10-CM | POA: Insufficient documentation

## 2024-07-01 DIAGNOSIS — R262 Difficulty in walking, not elsewhere classified: Secondary | ICD-10-CM | POA: Diagnosis present

## 2024-07-01 NOTE — Therapy (Signed)
 OUTPATIENT PHYSICAL THERAPY LOWER EXTREMITY EVALUATION   Patient Name: Lindsey Avila MRN: [id number] DOB:1975/11/17, 48 y.o., female Today's Date: 07/01/2024  END OF SESSION:  PT End of Session - 07/01/24 1026     Visit Number 1    Date for Recertification  08/21/24    Authorization Type [employer] Medicaid    PT Start Time 1021    PT Stop Time 1100    PT Time Calculation (min) 39 min    Activity Tolerance Patient tolerated treatment well    Behavior During Therapy WFL for tasks assessed/performed          Past Medical History:  Diagnosis Date   ADHD (attention deficit hyperactivity disorder) 05/2020   Allergy    Anemia    in remote past due to heavy periods   Anxiety    Chronic back pain    Depression 2009   prior with pregnancy   Encounter for routine gynecological examination    Dr. Marjorie Gull   GERD (gastroesophageal reflux disease)    IUD (intrauterine device) in place    IUD replaced 08/13/2018 with Mirena ; Dr. Marjorie Gull   Obesity    Recurrent urinary tract infection    no prior urology consult   Seasonal allergies    Skin anomaly    keratinosis   Wears glasses    Past Surgical History:  Procedure Laterality Date   CESAREAN SECTION  2009   KNEE ARTHROSCOPY     left, arthroscopic   Patient Active Problem List   Diagnosis Date Noted   Back pain 05/12/2024   Symptomatic mammary hypertrophy 05/12/2024   Neck pain 05/12/2024   MDD (major depressive disorder), recurrent, in partial remission 12/06/2023   Insomnia 09/20/2021   Attention deficit disorder 06/06/2020   Vitamin D  deficiency 10/07/2018   Obesity with serious comorbidity 08/23/2017    PCP: Carmelita Hutching, PA-C  REFERRING PROVIDER: Beverley Evalene BIRCH, MD  REFERRING DIAG: Right patellofemoral arthritis  THERAPY DIAG:  Right knee pain, unspecified chronicity - Plan: PT plan of care cert/re-cert  Muscle weakness (generalized) - Plan: PT plan of care cert/re-cert  Difficulty in walking, not  elsewhere classified - Plan: PT plan of care cert/re-cert  Rationale for Evaluation and Treatment: Rehabilitation  ONSET DATE: Chronic, but began getting worse in March 2025  SUBJECTIVE:   SUBJECTIVE STATEMENT: Patient reports that in March, she started having some pain and went to see Dr Beverley and was given a brace and a cortisone injection.  Patient states that she has not been able to wear her brace due to not being able to figure out how to put it on properly.  PERTINENT HISTORY: ADHD, Left knee arthroscopic surgery in 2006  PAIN:  Are you having pain? Yes: NPRS scale: 0-7/10 Pain location: right lateral knee Pain description: when the pain happens, it is sharp Aggravating factors: certain pivoting motions of knee Relieving factors: rest  PRECAUTIONS: None  RED FLAGS: None   WEIGHT BEARING RESTRICTIONS: No  FALLS:  Has patient fallen in last 6 months? No  LIVING ENVIRONMENT: Lives with: lives with their son (13 years old) Lives in: House/apartment Stairs: one level home with 3 steps to enter Has following equipment at home: None  OCCUPATION: Immunologist  PLOF: Independent and Leisure: spend time with friends, walking, yoga  PATIENT GOALS: To be able to walk without pain and stronger so that I can maintain mobility as I age.  NEXT MD VISIT: as needed  OBJECTIVE:  Note: Objective  measures were completed at Evaluation unless otherwise noted.  DIAGNOSTIC FINDINGS: Pt reports that she has had imaging that revealed bone on bone in her right knee  PATIENT SURVEYS:  LEFS  Extreme difficulty/unable (0), Quite a bit of difficulty (1), Moderate difficulty (2), Little difficulty (3), No difficulty (4) Survey date:  07/01/24  Any of your usual work, housework or school activities 3  2. Usual hobbies, recreational or sporting activities 1  3. Getting into/out of the bath 4  4. Walking between rooms 3  5. Putting on socks/shoes 4  6. Squatting  1  7.  Lifting an object, like a bag of groceries from the floor 2  8. Performing light activities around your home 3  9. Performing heavy activities around your home 1  10. Getting into/out of a car 2  11. Walking 2 blocks 2  12. Walking 1 mile 1  13. Going up/down 10 stairs (1 flight) 0  14. Standing for 1 hour 1  15.  sitting for 1 hour 3  16. Running on even ground 0  17. Running on uneven ground 0  18. Making sharp turns while running fast 0  19. Hopping  0  20. Rolling over in bed 3  Score total:  34/80 = 42.5%     COGNITION: Overall cognitive status: Within functional limits for tasks assessed     SENSATION: WFL   MUSCLE LENGTH: Hamstrings and quads: slight tightness bilaterally   PALPATION: Tender to palpation around right knee  LOWER EXTREMITY ROM:  Active ROM Right eval Left eval  Knee flexion 126 135  Knee extension -10 0   (Blank rows = not tested)  LOWER EXTREMITY MMT:  Eval: Right hip strength grossly 4/5 throughout Left hip strength grossly 4 to 4+/5 throughout Right quad strength of 4+/5 Left quad strength 5-/5 Bilateral hamstring strength of 5-/5  LOWER EXTREMITY SPECIAL TESTS:  Knee special tests: Anterior drawer test: negative and Posterior drawer test: positive   FUNCTIONAL TESTS:  Eval: 5 times sit to stand: 8.75 sec Single leg stance:  right- 30 sec, left- 30 sec  GAIT: Distance walked: >400 ft Assistive device utilized: None Level of assistance: Complete Independence Comments: Patient reports that she starts having lateral right knee pain with increased ambulation                                                                                                                                TREATMENT DATE:  07/01/2024: Reviewed role of PT and issued HEP  For all possible CPT codes, reference the Planned Interventions line above.     Check all conditions that are expected to impact treatment: Conditions expected to impact  treatment:Musculoskeletal disorders   If treatment provided at initial evaluation, no treatment charged due to lack of authorization.         PATIENT EDUCATION:  Education details: Issued HEP Person educated: Patient Education method: Explanation, Demonstration, and Handouts  Education comprehension: verbalized understanding and returned demonstration  HOME EXERCISE PROGRAM: Access Code: [email or web address] URL: [email or web address] Date: 07/01/2024 Prepared by: Jarrell Tyshae Stair  Exercises - Seated Table Hamstring Stretch  - 1 x daily - 7 x weekly - 2 reps - 20 sec hold - Quadriceps Stretch with Chair  - 1 x daily - 7 x weekly - 1-2 reps - 20 sec hold - Seated Long Arc Quad  - 1 x daily - 7 x weekly - 2 sets - 10 reps - Heel Toe Raises with Counter Support  - 1 x daily - 7 x weekly - 2 sets - 10 reps - Standing Hip Abduction with Counter Support  - 1 x daily - 7 x weekly - 2 sets - 10 reps - Standing Hip Extension with Counter Support  - 1 x daily - 7 x weekly - 2 sets - 10 reps  ASSESSMENT:  CLINICAL IMPRESSION: Patient is a 48 y.o. female who was seen today for physical therapy evaluation and treatment for right patellofemoral arthritis. Patient presents to skilled PT with right knee pain, but she does report that she sometimes have some left knee pain, as well.  Patient presents with increased knee pain, muscle weakness, difficulty walking, and decreased ability to perform functional tasks.  OBJECTIVE IMPAIRMENTS: difficulty walking, decreased ROM, decreased strength, increased muscle spasms, impaired flexibility, and pain.   ACTIVITY LIMITATIONS: carrying, lifting, standing, stairs, and locomotion level  PARTICIPATION LIMITATIONS: cleaning and community activity  PERSONAL FACTORS: Time since onset of injury/illness/exacerbation and 1-2 comorbidities: ADHD, left knee arthroscopic surgery in 2006 are also affecting patient's functional outcome.   REHAB POTENTIAL:  Good  CLINICAL DECISION MAKING: Stable/uncomplicated  EVALUATION COMPLEXITY: Low   GOALS: Goals reviewed with patient? Yes  SHORT TERM GOALS: Target date: 07/24/2024 Patient will be independent with initial HEP. Baseline: Goal status: INITIAL  2.  Patient will participate in a 6 minute walk test to establish baseline. Baseline:  Goal status: INITIAL  3.  Patient will increase right knee extension A/ROM to -5 degrees from full extension. Baseline:  Goal status: INITIAL   LONG TERM GOALS: Target date: 08/23/2024  Patient will be independent with advanced HEP to allow for self progression after discharge. Baseline:  Goal status: INITIAL  2.  Patient will improve Lower Extremity Functional Scale (LEFS) to at least 60% to demonstrate improved functional mobility. Baseline: 42.5% Goal status: INITIAL  3.  Patient will increase bilateral LE strength to [facility] to allow her to navigate stairs with reciprocal pattern. Baseline:  Goal status: INITIAL  4.  Patient will report being able to ambulate greater than 20 minutes without increased knee pain. Baseline:  Goal status: INITIAL  5.  Patient will improve right knee A/ROM to [facility] to allow her to demonstrate improved knee mechanics during stairs and ambulation. Baseline: -10 to 126 degrees Goal status: INITIAL     PLAN:  PT FREQUENCY: 2x/week  PT DURATION: 8 weeks  PLANNED INTERVENTIONS: 97164- PT Re-evaluation, 97750- Physical Performance Testing, 97110-Therapeutic exercises, 97530- Therapeutic activity, [id number]- Neuromuscular re-education, 97535- Self Care, [id number]- Manual therapy, [id number]- Gait training, [phone]- Canalith repositioning, [id number]- Aquatic Therapy, H9716- Electrical stimulation (unattended), [phone]- Electrical stimulation (manual), [id number]- Vasopneumatic device, [id number]- Ultrasound, [id number]- Traction (mechanical), [id number]- Ionotophoresis 4mg /ml Dexamethasone, 79439 (1-2 muscles), 20561 (3+ muscles)- Dry Needling, Patient/Family  education, Balance training, Stair training, Taping, Joint mobilization, Joint manipulation, Spinal manipulation, Spinal mobilization, Vestibular training, Cryotherapy, and Moist heat  PLAN FOR NEXT SESSION: Assess and progress HEP as indicated, strengthening, flexibility,  manual/dry needling as indicated    Jarrell Laming, PT, DPT 07/01/24, 1:26 PM  [facility] [address], [address] [place], [place] [address] Phone # [phone] Fax [phone]

## 2024-07-02 ENCOUNTER — Encounter: Payer: Self-pay | Admitting: Dermatology

## 2024-07-02 ENCOUNTER — Ambulatory Visit (INDEPENDENT_AMBULATORY_CARE_PROVIDER_SITE_OTHER): Admitting: Dermatology

## 2024-07-02 VITALS — BP 121/83 | HR 85

## 2024-07-02 DIAGNOSIS — L811 Chloasma: Secondary | ICD-10-CM

## 2024-07-02 MED ORDER — SAFETY SEAL MISCELLANEOUS MISC: 1.0000 | Freq: Every evening | 6 refills | Status: AC

## 2024-07-02 NOTE — Progress Notes (Signed)
   New Patient Visit   Subjective  Lindsey Avila is a 48 y.o. female who presents for the following: melasma  Patient states she has melasma located at the face that she would like to have examined. Patient reports the areas have been there for 2 years. Patient reports she has not previously been treated for these areas. Patient reports that she has tried vitamin C serums. Patient reports that she has tried several OTC lotions that claim to help with dark spots. Patient has also tried at home red light therapy. Patient currently uses La-Roche Posay face wash. Patient currently uses Bioma face moisturizer. Patient reports that she wears sunscreen everyday.  The following portions of the chart were reviewed this encounter and updated as appropriate: medications, allergies, medical history  Review of Systems:  No other skin or systemic complaints except as noted in HPI or Assessment and Plan.  Objective  Well appearing patient in no apparent distress; mood and affect are within normal limits.  A focused examination was performed of the following areas: face  Relevant exam findings are noted in the Assessment and Plan.         Assessment & Plan  MELASMA Exam: reticulated hyperpigmented patches at face  Initial visit, not at goal  Melasma is a chronic; persistent condition of hyperpigmented patches generally on the face, worse in summer due to higher UV exposure.    Heredity; thyroid  disease; sun exposure; pregnancy; birth control pills; epilepsy medication and darker skin may predispose to Melasma.    - Assessment: Patient has hyperpigmented patches on bilateral cheeks consistent with melasma diagnosis. She is not currently using any prescription treatments.  - Plan:    Continue washing with gentle cleanser    Apply Eucerin Radiant Tone in the morning followed by sunscreen    Hydroquinone 12% every evening for 4 months, then transition treatment    Prescription sent to [facility] with mailing instructions provided  Follow-up in 4 months.    MELASMA   Related Medications Safety Seal Miscellaneous MISC Apply 1 Application topically at bedtime. Medication name, Hy-Glow (Hydroquinone 12%, Kojic Acid 6%)  Return in about 4 months (around 11/02/2024) for Melasma F/U.  I, Lyle Cords, as acting as a Neurosurgeon for [employer], DO .   Documentation: I have reviewed the above documentation for accuracy and completeness, and I agree with the above.  Delon Lenis, DO

## 2024-07-02 NOTE — Patient Instructions (Addendum)
 Date: Thu Jul 02 2024  Hello Lindsey Avila,  Thank you for visiting us  today. We appreciate your commitment to improving your skin and hair health. Here is a summary of the key instructions from today's consultation:  - Prescription Medications:   - Hydroquinone 12%: Apply every evening for 4 months  - Skincare Routine:   - Continue washing with your gentle cleanser   - Apply Eucerin Radiant Tone in the morning followed by sunscreen   - Apply hydroquinone after evening cleansing  - Important Notes:  Hydroquinone 12% prescription will be sent to [facility]. They will mail the medication to you. After 4 months, we will transition to a different treatment.  - Follow-Up:  We will see you again in 4 months to assess your progress and make any necessary adjustments to your treatment plan.  We look forward to seeing improvement at your next visit. If you have any questions or concerns before then, please do not hesitate to contact our office through MyChart or by phone.  Warm regards,  Dr. Delon Lenis,  Dermatology    Important Information  Due to recent changes in healthcare laws, you may see results of your pathology and/or laboratory studies on MyChart before the doctors have had a chance to review them. We understand that in some cases there may be results that are confusing or concerning to you. Please understand that not all results are received at the same time and often the doctors may need to interpret multiple results in order to provide you with the best plan of care or course of treatment. Therefore, we ask that you please give us  2 business days to thoroughly review all your results before contacting the office for clarification. Should we see a critical lab result, you will be contacted sooner.   If You Need Anything After Your Visit  If you have any questions or concerns for your doctor, please call our main line at [phone] If no one answers, please leave a  voicemail as directed and we will return your call as soon as possible. Messages left after 4 pm will be answered the following business day.   You may also send us  a message via MyChart. We typically respond to MyChart messages within 1-2 business days.  For prescription refills, please ask your pharmacy to contact our office. Our fax number is [phone].  If you have an urgent issue when the clinic is closed that cannot wait until the next business day, you can page your doctor at the number below.    Please note that while we do our best to be available for urgent issues outside of office hours, we are not available 24/7.   If you have an urgent issue and are unable to reach us , you may choose to seek medical care at your doctor's office, retail clinic, urgent care center, or emergency room.  If you have a medical emergency, please immediately call 911 or go to the emergency department. In the event of inclement weather, please call our main line at [phone] for an update on the status of any delays or closures.  Dermatology Medication Tips: Please keep the boxes that topical medications come in in order to help keep track of the instructions about where and how to use these. Pharmacies typically print the medication instructions only on the boxes and not directly on the medication tubes.   If your medication is too expensive, please contact our office at [phone] or send us  a message  through MyChart.   We are unable to tell what your co-pay for medications will be in advance as this is different depending on your insurance coverage. However, we may be able to find a substitute medication at lower cost or fill out paperwork to get insurance to cover a needed medication.   If a prior authorization is required to get your medication covered by your insurance company, please allow us  1-2 business days to complete this process.  Drug prices often vary depending on where the  prescription is filled and some pharmacies may offer cheaper prices.  The website www.goodrx.com contains coupons for medications through different pharmacies. The prices here do not account for what the cost may be with help from insurance (it may be cheaper with your insurance), but the website can give you the price if you did not use any insurance.  - You can print the associated coupon and take it with your prescription to the pharmacy.  - You may also stop by our office during regular business hours and pick up a GoodRx coupon card.  - If you need your prescription sent electronically to a different pharmacy, notify our office through [facility] or by phone at [phone]

## 2024-07-07 ENCOUNTER — Encounter: Payer: Self-pay | Admitting: Rehabilitative and Restorative Service Providers"

## 2024-07-07 ENCOUNTER — Ambulatory Visit: Admitting: Rehabilitative and Restorative Service Providers"

## 2024-07-07 DIAGNOSIS — M25561 Pain in right knee: Secondary | ICD-10-CM | POA: Diagnosis not present

## 2024-07-07 DIAGNOSIS — R262 Difficulty in walking, not elsewhere classified: Secondary | ICD-10-CM

## 2024-07-07 DIAGNOSIS — M6281 Muscle weakness (generalized): Secondary | ICD-10-CM

## 2024-07-07 NOTE — Therapy (Signed)
 OUTPATIENT PHYSICAL THERAPY TREATMENT NOTE   Patient Name: Lindsey Avila MRN: [id number] DOB:Feb 27, 1976, 48 y.o., female Today's Date: 07/07/2024  END OF SESSION:  PT End of Session - 07/07/24 1410     Visit Number 2    Date for Recertification  08/21/24    Authorization Type [employer] Medicaid    PT Start Time 1408   Pt arrived late   PT Stop Time 1446    PT Time Calculation (min) 38 min    Activity Tolerance Patient tolerated treatment well    Behavior During Therapy WFL for tasks assessed/performed          Past Medical History:  Diagnosis Date   ADHD (attention deficit hyperactivity disorder) 05/2020   Allergy    Anemia    in remote past due to heavy periods   Anxiety    Chronic back pain    Depression 2009   prior with pregnancy   Encounter for routine gynecological examination    Dr. Marjorie Gull   GERD (gastroesophageal reflux disease)    IUD (intrauterine device) in place    IUD replaced 08/13/2018 with Mirena ; Dr. Marjorie Gull   Obesity    Recurrent urinary tract infection    no prior urology consult   Seasonal allergies    Skin anomaly    keratinosis   Wears glasses    Past Surgical History:  Procedure Laterality Date   CESAREAN SECTION  2009   KNEE ARTHROSCOPY     left, arthroscopic   Patient Active Problem List   Diagnosis Date Noted   Back pain 05/12/2024   Symptomatic mammary hypertrophy 05/12/2024   Neck pain 05/12/2024   MDD (major depressive disorder), recurrent, in partial remission 12/06/2023   Insomnia 09/20/2021   Attention deficit disorder 06/06/2020   Vitamin D  deficiency 10/07/2018   Obesity with serious comorbidity 08/23/2017    PCP: Carmelita Hutching, PA-C  REFERRING PROVIDER: Beverley Evalene BIRCH, MD  REFERRING DIAG: Right patellofemoral arthritis  THERAPY DIAG:  Right knee pain, unspecified chronicity  Muscle weakness (generalized)  Difficulty in walking, not elsewhere classified  Rationale for Evaluation and Treatment:  Rehabilitation  ONSET DATE: Chronic, but began getting worse in March 2025  SUBJECTIVE:   SUBJECTIVE STATEMENT: Patient admits that she has not been doing her exercises given at evaluation.  Pt reports that she went to yoga on Friday and had some difficulty with table top position.  PERTINENT HISTORY: ADHD, Left knee arthroscopic surgery in 2006  PAIN:  Are you having pain? Yes: NPRS scale: 2/10 Pain location: right lateral knee Pain description: when the pain happens, it is sharp Aggravating factors: certain pivoting motions of knee Relieving factors: rest  PRECAUTIONS: None  RED FLAGS: None   WEIGHT BEARING RESTRICTIONS: No  FALLS:  Has patient fallen in last 6 months? No  LIVING ENVIRONMENT: Lives with: lives with their son (37 years old) Lives in: House/apartment Stairs: one level home with 3 steps to enter Has following equipment at home: None  OCCUPATION: Immunologist  PLOF: Independent and Leisure: spend time with friends, walking, yoga  PATIENT GOALS: To be able to walk without pain and stronger so that I can maintain mobility as I age.  NEXT MD VISIT: as needed  OBJECTIVE:  Note: Objective measures were completed at Evaluation unless otherwise noted.  DIAGNOSTIC FINDINGS: Pt reports that she has had imaging that revealed bone on bone in her right knee  PATIENT SURVEYS:  LEFS  Extreme difficulty/unable (0), Quite a bit  of difficulty (1), Moderate difficulty (2), Little difficulty (3), No difficulty (4) Survey date:  07/01/24  Any of your usual work, housework or school activities 3  2. Usual hobbies, recreational or sporting activities 1  3. Getting into/out of the bath 4  4. Walking between rooms 3  5. Putting on socks/shoes 4  6. Squatting  1  7. Lifting an object, like a bag of groceries from the floor 2  8. Performing light activities around your home 3  9. Performing heavy activities around your home 1  10. Getting into/out of  a car 2  11. Walking 2 blocks 2  12. Walking 1 mile 1  13. Going up/down 10 stairs (1 flight) 0  14. Standing for 1 hour 1  15.  sitting for 1 hour 3  16. Running on even ground 0  17. Running on uneven ground 0  18. Making sharp turns while running fast 0  19. Hopping  0  20. Rolling over in bed 3  Score total:  34/80 = 42.5%     COGNITION: Overall cognitive status: Within functional limits for tasks assessed     SENSATION: WFL   MUSCLE LENGTH: Hamstrings and quads: slight tightness bilaterally   PALPATION: Tender to palpation around right knee  LOWER EXTREMITY ROM:  Active ROM Right eval Left eval  Knee flexion 126 135  Knee extension -10 0   (Blank rows = not tested)  LOWER EXTREMITY MMT:  Eval: Right hip strength grossly 4/5 throughout Left hip strength grossly 4 to 4+/5 throughout Right quad strength of 4+/5 Left quad strength 5-/5 Bilateral hamstring strength of 5-/5  LOWER EXTREMITY SPECIAL TESTS:  Knee special tests: Anterior drawer test: negative and Posterior drawer test: positive   FUNCTIONAL TESTS:  Eval: 5 times sit to stand: 8.75 sec Single leg stance:  right- 30 sec, left- 30 sec  07/07/2024:  6 min walk:  1441 ft with 2/10 pain  GAIT: Distance walked: >400 ft Assistive device utilized: None Level of assistance: Complete Independence Comments: Patient reports that she starts having lateral right knee pain with increased ambulation                                                                                                                                TREATMENT DATE:   07/07/2024: Recumbent bike level 3 x5 min with PT present to assess status Seated hamstring stretch 2x20 sec bilat Seated LAQ with 2# ankle weights 2x10 bilat (with slow eccentric phase) Standing at barre with 2# ankle weights:  hip abduction, hip extension, heel/toe raise, hamstring curl.  2x10 each bilat Standing calf stretch leaning against wall 2x20 sec  bilat Standing quad stretch 2x20 sec bilat 6 min walk:  1441 ft with 2/10 pain   07/01/2024: Reviewed role of PT and issued HEP   PATIENT EDUCATION:  Education details: Issued HEP Person educated: Patient Education method: Explanation, Facilities manager, and Handouts Education comprehension:  verbalized understanding and returned demonstration  HOME EXERCISE PROGRAM: Access Code: [email or web address] URL: [email or web address] Date: 07/07/2024 Prepared by: Jarrell Nekita Pita  Exercises - Seated Table Hamstring Stretch  - 1 x daily - 7 x weekly - 2 reps - 20 sec hold - Quadriceps Stretch with Chair  - 1 x daily - 7 x weekly - 1-2 reps - 20 sec hold - Seated Long Arc Quad  - 1 x daily - 7 x weekly - 2 sets - 10 reps - Gastroc Stretch on Wall  - 1 x daily - 7 x weekly - 2 reps - 20 sec hold - Heel Toe Raises with Counter Support  - 1 x daily - 7 x weekly - 2 sets - 10 reps - Standing Hip Abduction with Counter Support  - 1 x daily - 7 x weekly - 2 sets - 10 reps - Standing Hip Extension with Counter Support  - 1 x daily - 7 x weekly - 2 sets - 10 reps - Standing Knee Flexion with Counter Support  - 1 x daily - 7 x weekly - 2 sets - 10 reps  ASSESSMENT:  CLINICAL IMPRESSION:  Ms Kok presents to skilled PT admitting that she had not been performing her HEP, but that she will try to set an alarm to ensure that she improves her compliance.  Patient was encouraged about the benefits for performing her HEP and she verbalizes understanding.  Patient was able to perform exercises in clinic with 2# ankle weight with minimal cuing for technique and body mechanics.  Able to obtain a 6 minute walk baseline measurement during today's session.  Patient continues to require skilled PT to progress towards goal related activities.  OBJECTIVE IMPAIRMENTS: difficulty walking, decreased ROM, decreased strength, increased muscle spasms, impaired flexibility, and pain.   ACTIVITY LIMITATIONS: carrying,  lifting, standing, stairs, and locomotion level  PARTICIPATION LIMITATIONS: cleaning and community activity  PERSONAL FACTORS: Time since onset of injury/illness/exacerbation and 1-2 comorbidities: ADHD, left knee arthroscopic surgery in 2006 are also affecting patient's functional outcome.   REHAB POTENTIAL: Good  CLINICAL DECISION MAKING: Stable/uncomplicated  EVALUATION COMPLEXITY: Low   GOALS: Goals reviewed with patient? Yes  SHORT TERM GOALS: Target date: 07/24/2024 Patient will be independent with initial HEP. Baseline: Goal status: Ongoing  2.  Patient will participate in a 6 minute walk test to establish baseline. Baseline:  Goal status: Met on 07/07/2024  3.  Patient will increase right knee extension A/ROM to -5 degrees from full extension. Baseline:  Goal status: INITIAL   LONG TERM GOALS: Target date: 08/23/2024  Patient will be independent with advanced HEP to allow for self progression after discharge. Baseline:  Goal status: INITIAL  2.  Patient will improve Lower Extremity Functional Scale (LEFS) to at least 60% to demonstrate improved functional mobility. Baseline: 42.5% Goal status: INITIAL  3.  Patient will increase bilateral LE strength to [facility] to allow her to navigate stairs with reciprocal pattern. Baseline:  Goal status: INITIAL  4.  Patient will report being able to ambulate greater than 20 minutes without increased knee pain. Baseline:  Goal status: INITIAL  5.  Patient will improve right knee A/ROM to [facility] to allow her to demonstrate improved knee mechanics during stairs and ambulation. Baseline: -10 to 126 degrees Goal status: INITIAL     PLAN:  PT FREQUENCY: 2x/week  PT DURATION: 8 weeks  PLANNED INTERVENTIONS: 97164- PT Re-evaluation, 97750- Physical Performance Testing, 97110-Therapeutic exercises, 97530- Therapeutic activity, [id number]- Neuromuscular re-education, 97535- Self  Care, 02859- Manual therapy, [phone]- Gait training, [phone]-  Canalith repositioning, [phone]- Aquatic Therapy, [phone]- Electrical stimulation (unattended), [phone]- Electrical stimulation (manual), [id number]- Vasopneumatic device, [id number]- Ultrasound, [id number]- Traction (mechanical), [id number]- Ionotophoresis 4mg /ml Dexamethasone, 20560 (1-2 muscles), 20561 (3+ muscles)- Dry Needling, Patient/Family education, Balance training, Stair training, Taping, Joint mobilization, Joint manipulation, Spinal manipulation, Spinal mobilization, Vestibular training, Cryotherapy, and Moist heat  PLAN FOR NEXT SESSION: Assess and progress HEP as indicated, strengthening, flexibility, manual/dry needling as indicated    Jarrell Laming, PT, DPT 07/07/24, 3:04 PM  [facility] [address], [address] [place], [place] [address] Phone # [phone] Fax [phone]

## 2024-07-09 ENCOUNTER — Ambulatory Visit: Admitting: Physical Therapy

## 2024-07-09 ENCOUNTER — Encounter: Payer: Self-pay | Admitting: Physical Therapy

## 2024-07-09 DIAGNOSIS — M6281 Muscle weakness (generalized): Secondary | ICD-10-CM

## 2024-07-09 DIAGNOSIS — M25561 Pain in right knee: Secondary | ICD-10-CM

## 2024-07-09 DIAGNOSIS — R262 Difficulty in walking, not elsewhere classified: Secondary | ICD-10-CM

## 2024-07-09 NOTE — Therapy (Signed)
 OUTPATIENT PHYSICAL THERAPY TREATMENT NOTE   Patient Name: Lindsey Avila MRN: [id number] DOB:Jan 18, 1976, 48 y.o., female Today's Date: 07/09/2024  END OF SESSION:  PT End of Session - 07/09/24 1402     Visit Number 3    Date for Recertification  08/21/24    Authorization Type [employer]    PT Start Time 1401    PT Stop Time 1443    PT Time Calculation (min) 42 min    Activity Tolerance Patient tolerated treatment well          Past Medical History:  Diagnosis Date   ADHD (attention deficit hyperactivity disorder) 05/2020   Allergy    Anemia    in remote past due to heavy periods   Anxiety    Chronic back pain    Depression 2009   prior with pregnancy   Encounter for routine gynecological examination    Dr. Marjorie Gull   GERD (gastroesophageal reflux disease)    IUD (intrauterine device) in place    IUD replaced 08/13/2018 with Mirena ; Dr. Marjorie Gull   Obesity    Recurrent urinary tract infection    no prior urology consult   Seasonal allergies    Skin anomaly    keratinosis   Wears glasses    Past Surgical History:  Procedure Laterality Date   CESAREAN SECTION  2009   KNEE ARTHROSCOPY     left, arthroscopic   Patient Active Problem List   Diagnosis Date Noted   Back pain 05/12/2024   Symptomatic mammary hypertrophy 05/12/2024   Neck pain 05/12/2024   MDD (major depressive disorder), recurrent, in partial remission 12/06/2023   Insomnia 09/20/2021   Attention deficit disorder 06/06/2020   Vitamin D  deficiency 10/07/2018   Obesity with serious comorbidity 08/23/2017    PCP: Carmelita Hutching, PA-C  REFERRING PROVIDER: Beverley Evalene BIRCH, MD  REFERRING DIAG: Right patellofemoral arthritis  THERAPY DIAG:  Right knee pain, unspecified chronicity  Muscle weakness (generalized)  Difficulty in walking, not elsewhere classified  Rationale for Evaluation and Treatment: Rehabilitation  ONSET DATE: Chronic, but began getting worse in March  2025  SUBJECTIVE:   SUBJECTIVE STATEMENT: Hurts behind my knee which is different.  No pain today, had some pain yesterday.  The timer is off on the Medbridge.   PERTINENT HISTORY: ADHD, Left knee arthroscopic surgery in 2006  PAIN:  Are you having pain? Yes: NPRS scale: 0/10 Pain location: right lateral knee Pain description: when the pain happens, it is sharp Aggravating factors: certain pivoting motions of knee Relieving factors: rest  PRECAUTIONS: None  RED FLAGS: None   WEIGHT BEARING RESTRICTIONS: No  FALLS:  Has patient fallen in last 6 months? No  LIVING ENVIRONMENT: Lives with: lives with their son (76 years old) Lives in: House/apartment Stairs: one level home with 3 steps to enter Has following equipment at home: None  OCCUPATION: Immunologist  PLOF: Independent and Leisure: spend time with friends, walking, yoga  PATIENT GOALS: To be able to walk without pain and stronger so that I can maintain mobility as I age.  NEXT MD VISIT: as needed  OBJECTIVE:  Note: Objective measures were completed at Evaluation unless otherwise noted.  DIAGNOSTIC FINDINGS: Pt reports that she has had imaging that revealed bone on bone in her right knee  PATIENT SURVEYS:  LEFS  Extreme difficulty/unable (0), Quite a bit of difficulty (1), Moderate difficulty (2), Little difficulty (3), No difficulty (4) Survey date:  07/01/24  Any of your usual  work, housework or school activities 3  2. Usual hobbies, recreational or sporting activities 1  3. Getting into/out of the bath 4  4. Walking between rooms 3  5. Putting on socks/shoes 4  6. Squatting  1  7. Lifting an object, like a bag of groceries from the floor 2  8. Performing light activities around your home 3  9. Performing heavy activities around your home 1  10. Getting into/out of a car 2  11. Walking 2 blocks 2  12. Walking 1 mile 1  13. Going up/down 10 stairs (1 flight) 0  14. Standing for 1 hour  1  15.  sitting for 1 hour 3  16. Running on even ground 0  17. Running on uneven ground 0  18. Making sharp turns while running fast 0  19. Hopping  0  20. Rolling over in bed 3  Score total:  34/80 = 42.5%     COGNITION: Overall cognitive status: Within functional limits for tasks assessed     SENSATION: WFL   MUSCLE LENGTH: Hamstrings and quads: slight tightness bilaterally   PALPATION: Tender to palpation around right knee  LOWER EXTREMITY ROM:  Active ROM Right eval Left eval  Knee flexion 126 135  Knee extension -10 0   (Blank rows = not tested)  LOWER EXTREMITY MMT:  Eval: Right hip strength grossly 4/5 throughout Left hip strength grossly 4 to 4+/5 throughout Right quad strength of 4+/5 Left quad strength 5-/5 Bilateral hamstring strength of 5-/5  LOWER EXTREMITY SPECIAL TESTS:  Knee special tests: Anterior drawer test: negative and Posterior drawer test: positive   FUNCTIONAL TESTS:  Eval: 5 times sit to stand: 8.75 sec Single leg stance:  right- 30 sec, left- 30 sec  07/07/2024:  6 min walk:  1441 ft with 2/10 pain  GAIT: Distance walked: >400 ft Assistive device utilized: None Level of assistance: Complete Independence Comments: Patient reports that she starts having lateral right knee pain with increased ambulation                                                                                                                                TREATMENT DATE:  07/09/2024: Nu-step L4 5 min while discussing status At the stairs 2nd step hip flexor, gastroc and quadratus lumborum muscle lengthening 3 sets of 5 right/left: Rocking forward and back Rocking forward and back with arm elevation Rocking forward and back with arm reach up and over At the stairs 2nd step hamstring stretch 5x right/left Right TKE medium pink 10x right/left Single leg heel raises 10x right/left  Double green band in crouch/hip hinge hip abduction and extension  diagonals 2x10  Supine HS curls with green physioball 10x with small curls  2 inch step downs with mirror feedback for patellofemoral alignment (maintain space b/w knees) 2 sets of 5 Sit to stand with space b/w knees 8x Hip abduction at the wall into the Pilates  ball 5 sec hold 8x right/left (better activation of glutes) RPE 2/10    07/07/2024: Recumbent bike level 3 x5 min with PT present to assess status Seated hamstring stretch 2x20 sec bilat Seated LAQ with 2# ankle weights 2x10 bilat (with slow eccentric phase) Standing at barre with 2# ankle weights:  hip abduction, hip extension, heel/toe raise, hamstring curl.  2x10 each bilat Standing calf stretch leaning against wall 2x20 sec bilat Standing quad stretch 2x20 sec bilat 6 min walk:  1441 ft with 2/10 pain   07/01/2024: Reviewed role of PT and issued HEP   PATIENT EDUCATION:  Education details: Issued HEP Person educated: Patient Education method: Explanation, Demonstration, and Handouts Education comprehension: verbalized understanding and returned demonstration  HOME EXERCISE PROGRAM: Access Code: [email or web address] URL: [email or web address] Date: 07/07/2024 Prepared by: Jarrell Menke  Exercises - Seated Table Hamstring Stretch  - 1 x daily - 7 x weekly - 2 reps - 20 sec hold - Quadriceps Stretch with Chair  - 1 x daily - 7 x weekly - 1-2 reps - 20 sec hold - Seated Long Arc Quad  - 1 x daily - 7 x weekly - 2 sets - 10 reps - Gastroc Stretch on Wall  - 1 x daily - 7 x weekly - 2 reps - 20 sec hold - Heel Toe Raises with Counter Support  - 1 x daily - 7 x weekly - 2 sets - 10 reps - Standing Hip Abduction with Counter Support  - 1 x daily - 7 x weekly - 2 sets - 10 reps - Standing Hip Extension with Counter Support  - 1 x daily - 7 x weekly - 2 sets - 10 reps - Standing Knee Flexion with Counter Support  - 1 x daily - 7 x weekly - 2 sets - 10 reps  ASSESSMENT:  CLINICAL IMPRESSION: Treatment focus on  strengthening of musculature needed to provide stability to the knee.  Verbal cues for patellofemoral alignment and to decrease compensatory strategies including trunk lean, rotation and pelvic drop during functional movements. She reports some intermittent pain during exercises but intensity remains low 2/10.    OBJECTIVE IMPAIRMENTS: difficulty walking, decreased ROM, decreased strength, increased muscle spasms, impaired flexibility, and pain.   ACTIVITY LIMITATIONS: carrying, lifting, standing, stairs, and locomotion level  PARTICIPATION LIMITATIONS: cleaning and community activity  PERSONAL FACTORS: Time since onset of injury/illness/exacerbation and 1-2 comorbidities: ADHD, left knee arthroscopic surgery in 2006 are also affecting patient's functional outcome.   REHAB POTENTIAL: Good  CLINICAL DECISION MAKING: Stable/uncomplicated  EVALUATION COMPLEXITY: Low   GOALS: Goals reviewed with patient? Yes  SHORT TERM GOALS: Target date: 07/24/2024 Patient will be independent with initial HEP. Baseline: Goal status: Ongoing  2.  Patient will participate in a 6 minute walk test to establish baseline. Baseline:  Goal status: Met on 07/07/2024  3.  Patient will increase right knee extension A/ROM to -5 degrees from full extension. Baseline:  Goal status: INITIAL   LONG TERM GOALS: Target date: 08/23/2024  Patient will be independent with advanced HEP to allow for self progression after discharge. Baseline:  Goal status: INITIAL  2.  Patient will improve Lower Extremity Functional Scale (LEFS) to at least 60% to demonstrate improved functional mobility. Baseline: 42.5% Goal status: INITIAL  3.  Patient will increase bilateral LE strength to [facility] to allow her to navigate stairs with reciprocal pattern. Baseline:  Goal status: INITIAL  4.  Patient will report being able to ambulate greater than 20 minutes  without increased knee pain. Baseline:  Goal status: INITIAL  5.   Patient will improve right knee A/ROM to [facility] to allow her to demonstrate improved knee mechanics during stairs and ambulation. Baseline: -10 to 126 degrees Goal status: INITIAL     PLAN:  PT FREQUENCY: 2x/week  PT DURATION: 8 weeks  PLANNED INTERVENTIONS: 97164- PT Re-evaluation, 97750- Physical Performance Testing, 97110-Therapeutic exercises, 97530- Therapeutic activity, 97112- Neuromuscular re-education, 97535- Self Care, 02859- Manual therapy, [phone]- Gait training, [phone]- Canalith repositioning, [id number]- Aquatic Therapy, [phone]- Electrical stimulation (unattended), [phone]- Electrical stimulation (manual), [id number]- Vasopneumatic device, [id number]- Ultrasound, [id number]- Traction (mechanical), [id number]- Ionotophoresis 4mg /ml Dexamethasone, 20560 (1-2 muscles), 20561 (3+ muscles)- Dry Needling, Patient/Family education, Balance training, Stair training, Taping, Joint mobilization, Joint manipulation, Spinal manipulation, Spinal mobilization, Vestibular training, Cryotherapy, and Moist heat  PLAN FOR NEXT SESSION: Assess and progress HEP as indicated, strengthening, flexibility, manual/dry needling as indicated    Glade Pesa, PT 07/09/24 9:51 PM Phone: [phone] Fax: [phone]  [facility] [address], [address] [place], [place] [address] Phone # [phone] Fax [phone]

## 2024-07-13 ENCOUNTER — Encounter: Payer: Self-pay | Admitting: Dermatology

## 2024-07-14 NOTE — Progress Notes (Unsigned)
 BH MD Outpatient Progress Note  07/15/2024 5:30 PM Lindsey Avila  MRN:  [id number]  Assessment:  Lindsey Avila presents for follow-up evaluation. Today, 07/15/24, patient reports that she took a 2 month holiday from stimulant medication and during this period realized the degree to which uncontrolled ADHD impacts her overall functionality, social interactions, and mood/anxiety. She remains interested in treatment and is amenable to trial of methylphenidate class given side effects with amphetamine  derivatives. Will plan to start as below pending review of UDS (patient to obtain today). She endorses overall stability of mood and will continue other psychotropics as prescribed outside of slight reduction in trazodone  to lessen cost given anticipated loss of insurance after November.   Patient was made aware of this provider's departure from [facility] at the end of Nov 2025 and that she will be transitioned to alternative provider in the clinic after this time. All questions/concerns addressed.  RTC in 4-5 weeks in person with next provider.  Identifying Information: Lindsey Avila is a 48 y.o. female with a history of MDD, anxiety, ADHD inattentive type, OSA, and Vitamin D  deficiency who is an established patient with [facility].   Plan:  # ADHD inattentive type Supported by neuropsychiatric testing at [facility] June 2023 Past medication trials: Vyvanse , Adderall (dry mouth/eyes), Wellbutrin  (allergic reaction), Qelbree, Strattera (blurred vision) Status of problem: uncontrolled Interventions: -- START methylphenidate 18 mg daily pending review of UDS -- Risks, benefits, and side effects including but not limited to HA, appetite decrease, elevated blood pressure and HR, insomnia were reviewed with informed consent provided -- Medical considerations: -- Patient to start CPAP for mild OSA -- Continue individual psychotherapy with Bernice Rao  [facility]  # MDD in partial remission Past medication trials: Lexapro  (irritable), Celexa (nightmares), naltrexone -bupropion  (allergic), Xanax , hydroxyzine Status of problem: stable Interventions: -- Continue Effexor  150 mg daily (i7/9/25) -- REDUCE trazodone  to 75 mg nightly to help with cost (patient to take 1/2 of 150 mg tablet)  # High risk medication use Interventions: Adderall: -- Vitals obtained at PCP appointment 02/18/24: BP 112/75 HR 76 -- EKG 06/20/20 NSR -- UDS ordered; patient denies use of any illicit substances - patient to obtain today  Patient was given contact information for behavioral health clinic and was instructed to call 911 for emergencies.   Subjective:  Chief Complaint:  Chief Complaint  Patient presents with   Medication Management    Interval History:   Chart review: -- Seen by pulmonology for sleep evaluation 06/24/24. Home sleep test performed and consistent with mild OSA. CPAP ordered.   Lindsey Avila reports mood overall has been fine but states she has not been very active with low energy/motivation. She will be starting CPAP soon. Notes that she has become aware of how her ADHD does impact mood/anxiety in that it requires more effort to do things.   Reports she took a 2 month break from Adderall and that during this time she has been a mess. Notices it impacts conversations (can't stop talking) and frequently throughout visit will put her hand over her mouth to stop herself from talking. Reports feeling fidgety. Dry eyes and mouth did improve upon stopping Adderall and notes this had been a very bothersome side effect.  Denies past trial of methylphenidate and amenable to trial of this class given side effects with amphetamine  class. She will be getting UDS done today (is currently outside [facility] currently). Reviewed plan to start methylphenidate ER 18 mg daily pending UDS.  She shares she will be losing Medicaid at the end of November; reviewed out of  pocket cost of this medication per goodrx and she expressed comfort with this price.  PDMP: -- Adderall ER 25 mg QTY 30 last filled 04/16/24  Visit Diagnosis:    ICD-10-CM   1. Attention deficit hyperactivity disorder (ADHD), predominantly inattentive type  F90.0     2. High risk medication use  Z79.899 Drug Screen, Urine    3. MDD (major depressive disorder), recurrent, in partial remission  F33.41       Past Psychiatric History:  Diagnoses: MDD; anxiety; ADHD inattentive type (supported by neuropsychiatric testing at [facility] June 2023) Medication trials: Vyvanse , Adderall (dry mouth/eyes), Wellbutrin  (allergic reaction), [place], Strattera (blurred vision); Lexapro  (irritable), Celexa (nightmares), naltrexone -bupropion  (allergic), Xanax , hydroxyzine, trazodone   Hospitalizations: denies Suicide attempts: denies SIB: denies Hx of violence towards others: denies Current access to guns: denies Substance use:  -- Etoh: rarely; once every few months -- Denies use of tobacco, or illicit drugs including cannabis  Past Medical History:  Past Medical History:  Diagnosis Date   ADHD (attention deficit hyperactivity disorder) 05/2020   Allergy    Anemia    in remote past due to heavy periods   Anxiety    Chronic back pain    Depression 2009   prior with pregnancy   Encounter for routine gynecological examination    Dr. Marjorie Gull   GERD (gastroesophageal reflux disease)    IUD (intrauterine device) in place    IUD replaced 08/13/2018 with Mirena ; Dr. Marjorie Gull   Obesity    Recurrent urinary tract infection    no prior urology consult   Seasonal allergies    Skin anomaly    keratinosis   Wears glasses     Past Surgical History:  Procedure Laterality Date   CESAREAN SECTION  2009   KNEE ARTHROSCOPY     left, arthroscopic    Family Psychiatric History:  Mother: depression Dad: alcohol use disorder Maternal grandfather: alcohol use  disorder Denies family history of sudden cardiac death or cardiac issues prior to 50 yo  Family History:  Family History  Problem Relation Age of Onset   Hypertension Mother    Hypothyroidism Mother    Obesity Mother    Macular degeneration Mother    Cataracts Mother    Cancer Mother        skin cancer   Asthma Mother    Depression Mother    Hypertension Father    Cancer Father        basal cell skin   Benign prostatic hyperplasia Father    Alcohol abuse Father    Obesity Maternal Uncle    Dementia Maternal Grandfather    Alcohol abuse Maternal Grandfather    COPD Maternal Grandmother    Asthma Maternal Grandmother    Heart disease Paternal Grandfather        MI   Hypertension Paternal Grandfather    Pancreatic cancer Paternal Grandmother    Cancer Paternal Grandmother        pancreatic   Stroke Neg Hx    Diabetes Neg Hx    Colon cancer Neg Hx    Esophageal cancer Neg Hx    Rectal cancer Neg Hx    Stomach cancer Neg Hx     Social History:  Academic/Vocational: 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;  works as best boy  Social History   Socioeconomic History   Marital status:  Divorced    Spouse name: Not on file   Number of children: Not on file   Years of education: Not on file   Highest education level: Not on file  Occupational History   Not on file  Tobacco Use   Smoking status: Former    Current packs/day: 0.00    Types: Cigarettes    Quit date: 09/17/1998    Years since quitting: 25.8   Smokeless tobacco: Never   Tobacco comments:    Socially and infrequently  Vaping Use   Vaping status: Never Used  Substance and Sexual Activity   Alcohol use: Yes    Alcohol/week: 1.0 standard drink of alcohol    Types: 1 Glasses of wine per week    Comment: Very infrequent - once every few months   Drug use: No   Sexual activity: Not Currently    Birth control/protection: I.U.D.  Other Topics Concern   Not on file  Social History  Narrative   Lives with 12yo son, cat and dog.     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    Exercise - kickboxing, but prior  with treadmill, cardio, circuit training.   05/2020   Social Drivers of Health   Financial Resource Strain: Medium Risk (12/06/2023)   Overall Financial Resource Strain (CARDIA)    Difficulty of Paying Living Expenses: Somewhat hard  Food Insecurity: No Food Insecurity (12/06/2023)   Hunger Vital Sign    Worried About Running Out of Food in the Last Year: Never true    Ran Out of Food in the Last Year: Never true  Transportation Needs: No Transportation Needs (12/06/2023)   PRAPARE - Administrator, Civil Service (Medical): No    Lack of Transportation (Non-Medical): No  Physical Activity: Inactive (12/06/2023)   Exercise Vital Sign    Days of Exercise per Week: 0 days    Minutes of Exercise per Session: 0 min  Stress: Stress Concern Present (12/06/2023)   [employer] of Occupational Health - Occupational Stress Questionnaire    Feeling of Stress : To some extent  Social Connections: Socially Isolated (12/06/2023)   Social Connection and Isolation Panel    Frequency of Communication with Friends and Family: More than three times a week    Frequency of Social Gatherings with Friends and Family: Three times a week    Attends Religious Services: Never    Active Member of Clubs or Organizations: No    Attends Banker Meetings: Never    Marital Status: Divorced    Allergies:  Allergies  Allergen Reactions   Sulfa Antibiotics Anaphylaxis and Hives   Bupropion  Hives   Contrave  [Naltrexone -Bupropion  Hcl Er] Hives   Latex    Penicillins     unknown    Current Medications: Current Outpatient Medications  Medication Sig Dispense Refill   Cholecalciferol 125 MCG (5000 UT) TABS Take 5,000 Units by mouth daily. (Patient not taking: Reported on 07/02/2024)     EPINEPHrine  0.3 mg/0.3 mL IJ SOAJ  injection Inject 0.3 mg into the muscle as needed for anaphylaxis. 2 each 0   FIBER ADULT GUMMIES PO Take 2 each by mouth daily in the afternoon.     levonorgestrel  (MIRENA , 52 MG,) 20 MCG/24HR IUD Mirena  20 mcg/24 hours (6 yrs) 52 mg intrauterine device  Take 1 device by intrauterine route.     Safety Seal Miscellaneous MISC Apply 1 Application topically at bedtime. Medication name,  Hy-Glow (Hydroquinone 12%, Kojic Acid 6%) 30 g 6   Theanine 200 MG CAPS Take 1 capsule by mouth daily.     traZODone  (DESYREL ) 150 MG tablet Take 0.5 tablets (75 mg total) by mouth at bedtime as needed for sleep. 15 tablet 0   [START ON 08/13/2024] traZODone  (DESYREL ) 150 MG tablet Take 0.5 tablets (75 mg total) by mouth at bedtime as needed for sleep. 45 tablet 1   venlafaxine  XR (EFFEXOR -XR) 150 MG 24 hr capsule Take 1 capsule (150 mg total) by mouth daily. 30 capsule 0   [START ON 08/13/2024] venlafaxine  XR (EFFEXOR -XR) 150 MG 24 hr capsule Take 1 capsule (150 mg total) by mouth daily. 90 capsule 1   WEGOVY 1 MG/0.5ML SOAJ Inject 1 mg into the skin once a week.     No current facility-administered medications for this visit.    ROS: See above  Objective:  Psychiatric Specialty Exam: Last menstrual period 06/18/2024.There is no height or weight on file to calculate BMI.  General Appearance: Casual and Well Groomed  Eye Contact:  Good  Speech:  Clear and Coherent and Normal Rate  Volume:  Normal  Mood:  fine  Affect:  Euthymic; engaged; Tax Inspector Content: Denies AVH; no overt delusional thought content on interview   Suicidal Thoughts:  No  Homicidal Thoughts:  No  Thought Process:  Goal Directed and Linear  Orientation:  Full (Time, Place, and Person)    Memory:  Grossly intact   Judgment:  Good  Insight:  Good  Concentration:  Concentration: Fair  Recall:  not formally assessed   Fund of Knowledge: Good  Language: Good  Psychomotor Activity:  Normal  Akathisia:  No  AIMS (if  indicated): NA  Assets:  Communication Skills Desire for Improvement Housing Leisure Time Physical Health Resilience Social Support Talents/Skills Transportation Vocational/Educational  ADL's:  Intact  Cognition: WNL  Sleep:  Fair   PE: General: sits comfortably in view of camera; no acute distress  Pulm: no increased work of breathing on room air  MSK: all extremity movements appear intact  Neuro: no focal neurological deficits observed  Gait & Station: unable to assess by video    Metabolic Disorder Labs: Lab Results  Component Value Date   HGBA1C 4.8 05/18/2022   MPG 91 08/23/2017   MPG 94 10/28/2014   No results found for: PROLACTIN Lab Results  Component Value Date   CHOL 162 05/18/2022   TRIG 77 05/18/2022   HDL 48 05/18/2022   CHOLHDL 4 06/23/2021   VLDL 17.4 06/23/2021   LDLCALC 99 05/18/2022   LDLCALC 108 (H) 06/23/2021   Lab Results  Component Value Date   TSH 1.90 05/18/2022   TSH 3.37 11/02/2021    Therapeutic Level Labs: No results found for: LITHIUM No results found for: VALPROATE No results found for: CBMZ  Screenings:  GAD-7    Flowsheet Row Office Visit from 12/13/2023 in [facility] Counselor from 12/06/2023 in [facility] Office Visit from 06/23/2021 in [facility] [place]  Total GAD-7 Score 12 14 10    PHQ2-9    Flowsheet Row Office Visit from 12/13/2023 in [facility] Counselor from 12/06/2023 in [facility] Video Visit from 09/20/2021 in [facility] [place] Office Visit from 06/23/2021 in [facility] [employer] Visit from 06/06/2020 in [facility]  PHQ-2  Total Score 2 6 0 2 0  PHQ-9 Total Score 12 17 -- 12 --   Flowsheet Row Office Visit from 12/13/2023 in [facility] Counselor from  12/06/2023 in [facility]  C-SSRS RISK CATEGORY No Risk No Risk    Review of neuropsychological testing from [facility] 02/22/22: Per report summary: Neuropsychiatric questionnaire shows severe problems with attention and memory. Objective testing with Qb results showed statistically significant abnormalities in attention and activity.   Collaboration of Care: Collaboration of Care: Medication Management AEB active medication management, Psychiatrist AEB established with this provider, and Referral or follow-up with counselor/therapist AEB established with individual psychotherapy  Patient/Guardian was advised Release of Information must be obtained prior to any record release in order to collaborate their care with an outside provider. Patient/Guardian was advised if they have not already done so to contact the registration department to sign all necessary forms in order for us  to release information regarding their care.   Consent: Patient/Guardian gives verbal consent for treatment and assignment of benefits for services provided during this visit. Patient/Guardian expressed understanding and agreed to proceed.   Televisit via video: I connected with patient on 07/15/24 at 11:00 AM EDT by a video enabled telemedicine application and verified that I am speaking with the correct person using two identifiers.  Location: Patient: home address in [place] Provider: remote office in [place]   I discussed the limitations of evaluation and management by telemedicine and the availability of in person appointments. The patient expressed understanding and agreed to proceed.  I discussed the assessment and treatment plan with the patient. The patient was provided an opportunity to ask questions and all were answered. The patient agreed with the plan and demonstrated an understanding of the instructions.   The patient was advised to call back or seek an in-person  evaluation if the symptoms worsen or if the condition fails to improve as anticipated.  I provided 35 minutes dedicated to the care of this patient via video on the date of this encounter to include chart review, face-to-face time with the patient, medication management/counseling, documentation.  Corene Resnick A Khole Arterburn 07/15/2024, 5:30 PM

## 2024-07-15 ENCOUNTER — Telehealth (INDEPENDENT_AMBULATORY_CARE_PROVIDER_SITE_OTHER): Admitting: Psychiatry

## 2024-07-15 ENCOUNTER — Other Ambulatory Visit (HOSPITAL_COMMUNITY): Payer: Self-pay | Admitting: Psychiatry

## 2024-07-15 ENCOUNTER — Encounter: Payer: Self-pay | Admitting: Rehabilitative and Restorative Service Providers"

## 2024-07-15 ENCOUNTER — Ambulatory Visit: Admitting: Rehabilitative and Restorative Service Providers"

## 2024-07-15 ENCOUNTER — Encounter (HOSPITAL_COMMUNITY): Payer: Self-pay | Admitting: Psychiatry

## 2024-07-15 ENCOUNTER — Encounter (HOSPITAL_COMMUNITY): Payer: Self-pay

## 2024-07-15 DIAGNOSIS — M25561 Pain in right knee: Secondary | ICD-10-CM

## 2024-07-15 DIAGNOSIS — Z79899 Other long term (current) drug therapy: Secondary | ICD-10-CM | POA: Diagnosis not present

## 2024-07-15 DIAGNOSIS — M6281 Muscle weakness (generalized): Secondary | ICD-10-CM

## 2024-07-15 DIAGNOSIS — R262 Difficulty in walking, not elsewhere classified: Secondary | ICD-10-CM

## 2024-07-15 DIAGNOSIS — F3341 Major depressive disorder, recurrent, in partial remission: Secondary | ICD-10-CM

## 2024-07-15 DIAGNOSIS — F900 Attention-deficit hyperactivity disorder, predominantly inattentive type: Secondary | ICD-10-CM | POA: Diagnosis not present

## 2024-07-15 MED ORDER — TRAZODONE HCL 150 MG PO TABS: 75.0000 mg | ORAL_TABLET | Freq: Every evening | ORAL | 0 refills | Status: AC | PRN

## 2024-07-15 MED ORDER — VENLAFAXINE HCL ER 150 MG PO CP24: 150.0000 mg | ORAL_CAPSULE | Freq: Every day | ORAL | 1 refills | Status: DC

## 2024-07-15 MED ORDER — VENLAFAXINE HCL ER 150 MG PO CP24: 150.0000 mg | ORAL_CAPSULE | Freq: Every day | ORAL | 0 refills | Status: AC

## 2024-07-15 MED ORDER — TRAZODONE HCL 150 MG PO TABS: 75.0000 mg | ORAL_TABLET | Freq: Every evening | ORAL | 1 refills | Status: DC | PRN

## 2024-07-15 NOTE — Therapy (Signed)
 OUTPATIENT PHYSICAL THERAPY TREATMENT NOTE   Patient Name: Lindsey Avila MRN: [id number] DOB:1975/09/23, 48 y.o., female Today's Date: 07/15/2024  END OF SESSION:  PT End of Session - 07/15/24 0853     Visit Number 4    Date for Recertification  08/21/24    Authorization Type [employer] Medicaid    PT Start Time [phone]    PT Stop Time 0930    PT Time Calculation (min) 38 min    Activity Tolerance Patient tolerated treatment well    Behavior During Therapy [facility] for tasks assessed/performed          Past Medical History:  Diagnosis Date   ADHD (attention deficit hyperactivity disorder) 05/2020   Allergy    Anemia    in remote past due to heavy periods   Anxiety    Chronic back pain    Depression 2009   prior with pregnancy   Encounter for routine gynecological examination    Dr. Marjorie Gull   GERD (gastroesophageal reflux disease)    IUD (intrauterine device) in place    IUD replaced 08/13/2018 with Mirena ; Dr. Marjorie Gull   Obesity    Recurrent urinary tract infection    no prior urology consult   Seasonal allergies    Skin anomaly    keratinosis   Wears glasses    Past Surgical History:  Procedure Laterality Date   CESAREAN SECTION  2009   KNEE ARTHROSCOPY     left, arthroscopic   Patient Active Problem List   Diagnosis Date Noted   Back pain 05/12/2024   Symptomatic mammary hypertrophy 05/12/2024   Neck pain 05/12/2024   MDD (major depressive disorder), recurrent, in partial remission 12/06/2023   Insomnia 09/20/2021   Attention deficit disorder 06/06/2020   Vitamin D  deficiency 10/07/2018   Obesity with serious comorbidity 08/23/2017    PCP: Carmelita Hutching, PA-C  REFERRING PROVIDER: Beverley Evalene BIRCH, MD  REFERRING DIAG: Right patellofemoral arthritis  THERAPY DIAG:  Right knee pain, unspecified chronicity  Muscle weakness (generalized)  Difficulty in walking, not elsewhere classified  Rationale for Evaluation and Treatment: Rehabilitation  ONSET  DATE: Chronic, but began getting worse in March 2025  SUBJECTIVE:   SUBJECTIVE STATEMENT: Patient continues to deny pain during session.  Patient reports that she only occasionally has the knee pain.  Pt reports that she is feeling at least 10% better since starting PT.   PERTINENT HISTORY: ADHD, Left knee arthroscopic surgery in 2006  PAIN:  Are you having pain? Yes: NPRS scale: 0/10 Pain location: right lateral knee Pain description: when the pain happens, it is sharp Aggravating factors: certain pivoting motions of knee Relieving factors: rest  PRECAUTIONS: None  RED FLAGS: None   WEIGHT BEARING RESTRICTIONS: No  FALLS:  Has patient fallen in last 6 months? No  LIVING ENVIRONMENT: Lives with: lives with their son (23 years old) Lives in: House/apartment Stairs: one level home with 3 steps to enter Has following equipment at home: None  OCCUPATION: Immunologist  PLOF: Independent and Leisure: spend time with friends, walking, yoga  PATIENT GOALS: To be able to walk without pain and stronger so that I can maintain mobility as I age.  NEXT MD VISIT: as needed  OBJECTIVE:  Note: Objective measures were completed at Evaluation unless otherwise noted.  DIAGNOSTIC FINDINGS: Pt reports that she has had imaging that revealed bone on bone in her right knee  PATIENT SURVEYS:  LEFS  Extreme difficulty/unable (0), Quite a bit of difficulty (  1), Moderate difficulty (2), Little difficulty (3), No difficulty (4) Survey date:  07/01/24 07/15/24  Any of your usual work, housework or school activities 3 3  2. Usual hobbies, recreational or sporting activities 1 2  3. Getting into/out of the bath 4 2  4. Walking between rooms 3 4  5. Putting on socks/shoes 4 4  6. Squatting  1 1  7. Lifting an object, like a bag of groceries from the floor 2 3  8. Performing light activities around your home 3 3  9. Performing heavy activities around your home 1 2  10.  Getting into/out of a car 2 4  11. Walking 2 blocks 2 3  12. Walking 1 mile 1 3  13. Going up/down 10 stairs (1 flight) 0 1  14. Standing for 1 hour 1 2  15.  sitting for 1 hour 3 4  16. Running on even ground 0 1  17. Running on uneven ground 0 0  18. Making sharp turns while running fast 0 0  19. Hopping  0 1  20. Rolling over in bed 3 4  Score total:  34/80 = 42.5% 47/80 = 58.8%     COGNITION: Overall cognitive status: Within functional limits for tasks assessed     SENSATION: WFL   MUSCLE LENGTH: Hamstrings and quads: slight tightness bilaterally   PALPATION: Tender to palpation around right knee  LOWER EXTREMITY ROM:  Active ROM Right eval Right 07/15/24 Left eval  Knee flexion 126 130 135  Knee extension -10 -3 0   (Blank rows = not tested)  LOWER EXTREMITY MMT:  Eval: Right hip strength grossly 4/5 throughout Left hip strength grossly 4 to 4+/5 throughout Right quad strength of 4+/5 Left quad strength 5-/5 Bilateral hamstring strength of 5-/5  LOWER EXTREMITY SPECIAL TESTS:  Knee special tests: Anterior drawer test: negative and Posterior drawer test: positive   FUNCTIONAL TESTS:  Eval: 5 times sit to stand: 8.75 sec Single leg stance:  right- 30 sec, left- 30 sec  07/07/2024:   6 min walk:  1441 ft with 2/10 pain  GAIT: Distance walked: >400 ft Assistive device utilized: None Level of assistance: Complete Independence Comments: Patient reports that she starts having lateral right knee pain with increased ambulation                                                                                                                                TREATMENT DATE:   07/15/2024: Recumbent bike level 3 x5 min with PT present to assess status Seated hamstring stretch 2x20 sec bilat Lower Extremity Functional Scale 58.8% Seated LAQ with ball squeeze and 3# ankle weight 2x10 bilat Standing hamstring curl with 3# ankle weights 2x10 bilat Fwd high  marching with 3# ankle weights 4x15 ft Side stepping with yellow loop 2x10 ft bilat Standing calf stretch leaning against wall 2x20 sec bilat FWD step up to march on 6 step  2x5 bilat Standing Y taps 2x5 bilat   07/09/2024: Nu-step L4 5 min while discussing status At the stairs 2nd step hip flexor, gastroc and quadratus lumborum muscle lengthening 3 sets of 5 right/left: Rocking forward and back Rocking forward and back with arm elevation Rocking forward and back with arm reach up and over At the stairs 2nd step hamstring stretch 5x right/left Right TKE medium pink 10x right/left Single leg heel raises 10x right/left  Double green band in crouch/hip hinge hip abduction and extension diagonals 2x10  Supine HS curls with green physioball 10x with small curls  2 inch step downs with mirror feedback for patellofemoral alignment (maintain space b/w knees) 2 sets of 5 Sit to stand with space b/w knees 8x Hip abduction at the wall into the Pilates ball 5 sec hold 8x right/left (better activation of glutes) RPE 2/10    07/07/2024: Recumbent bike level 3 x5 min with PT present to assess status Seated hamstring stretch 2x20 sec bilat Seated LAQ with 2# ankle weights 2x10 bilat (with slow eccentric phase) Standing at barre with 2# ankle weights:  hip abduction, hip extension, heel/toe raise, hamstring curl.  2x10 each bilat Standing calf stretch leaning against wall 2x20 sec bilat Standing quad stretch 2x20 sec bilat 6 min walk:  1441 ft with 2/10 pain   PATIENT EDUCATION:  Education details: Issued HEP Person educated: Patient Education method: Explanation, Demonstration, and Handouts Education comprehension: verbalized understanding and returned demonstration  HOME EXERCISE PROGRAM: Access Code: [email or web address] URL: [email or web address] Date: 07/07/2024 Prepared by: Jarrell Yaw Escoto  Exercises - Seated Table Hamstring Stretch  - 1 x daily - 7 x weekly - 2 reps - 20 sec  hold - Quadriceps Stretch with Chair  - 1 x daily - 7 x weekly - 1-2 reps - 20 sec hold - Seated Long Arc Quad  - 1 x daily - 7 x weekly - 2 sets - 10 reps - Gastroc Stretch on Wall  - 1 x daily - 7 x weekly - 2 reps - 20 sec hold - Heel Toe Raises with Counter Support  - 1 x daily - 7 x weekly - 2 sets - 10 reps - Standing Hip Abduction with Counter Support  - 1 x daily - 7 x weekly - 2 sets - 10 reps - Standing Hip Extension with Counter Support  - 1 x daily - 7 x weekly - 2 sets - 10 reps - Standing Knee Flexion with Counter Support  - 1 x daily - 7 x weekly - 2 sets - 10 reps  ASSESSMENT:  CLINICAL IMPRESSION:  Ms Faas presents to skilled PT reporting that she feels that PT is helping and she is at least 10% better since starting PT.  Patient with improved Lower Extremity Functional Scale noted today.  Patient additionally with improved right knee A/ROM and is nearing the same ROM that her left knee has.  Patient is progressing with LE strength during session, as well as dynamic balance tasks.  Patient requires minimal cuing throughout for improved technique and improved knee positioning.  Patient with some fatigue reported by end of session, but reports that she feels that it was a good work out.  Patient continues to require skilled PT to progress with goal related activities.  OBJECTIVE IMPAIRMENTS: difficulty walking, decreased ROM, decreased strength, increased muscle spasms, impaired flexibility, and pain.   ACTIVITY LIMITATIONS: carrying, lifting, standing, stairs, and locomotion level  PARTICIPATION LIMITATIONS: cleaning and community activity  PERSONAL FACTORS: Time  since onset of injury/illness/exacerbation and 1-2 comorbidities: ADHD, left knee arthroscopic surgery in 2006 are also affecting patient's functional outcome.   REHAB POTENTIAL: Good  CLINICAL DECISION MAKING: Stable/uncomplicated  EVALUATION COMPLEXITY: Low   GOALS: Goals reviewed with patient? Yes  SHORT  TERM GOALS: Target date: 07/24/2024 Patient will be independent with initial HEP. Baseline: Goal status: Met on 07/15/24  2.  Patient will participate in a 6 minute walk test to establish baseline. Baseline:  Goal status: Met on 07/07/2024  3.  Patient will increase right knee extension A/ROM to -5 degrees from full extension. Baseline:  Goal status: Met on 07/15/24   LONG TERM GOALS: Target date: 08/23/2024  Patient will be independent with advanced HEP to allow for self progression after discharge. Baseline:  Goal status: Ongoing  2.  Patient will improve Lower Extremity Functional Scale (LEFS) to at least 70% to demonstrate improved functional mobility. Baseline: 42.5% Goal status: Ongoing (58.8% on 07/15/24)  3.  Patient will increase bilateral LE strength to [facility] to allow her to navigate stairs with reciprocal pattern. Baseline:  Goal status: Ongoing  4.  Patient will report being able to ambulate greater than 20 minutes without increased knee pain. Baseline:  Goal status: Ongoing  5.  Patient will improve right knee A/ROM to [facility] to allow her to demonstrate improved knee mechanics during stairs and ambulation. Baseline: -10 to 126 degrees Goal status: Ongoing (see above)     PLAN:  PT FREQUENCY: 2x/week  PT DURATION: 8 weeks  PLANNED INTERVENTIONS: 97164- PT Re-evaluation, 97750- Physical Performance Testing, 97110-Therapeutic exercises, 97530- Therapeutic activity, 97112- Neuromuscular re-education, 97535- Self Care, 02859- Manual therapy, [phone]- Gait training, [phone]- Canalith repositioning, [id number]- Aquatic Therapy, [phone]- Electrical stimulation (unattended), [phone]- Electrical stimulation (manual), [id number]- Vasopneumatic device, [id number]- Ultrasound, [id number]- Traction (mechanical), [id number]- Ionotophoresis 4mg /ml Dexamethasone, 20560 (1-2 muscles), 20561 (3+ muscles)- Dry Needling, Patient/Family education, Balance training, Stair training, Taping, Joint mobilization, Joint  manipulation, Spinal manipulation, Spinal mobilization, Vestibular training, Cryotherapy, and Moist heat  PLAN FOR NEXT SESSION: Assess and progress HEP as indicated, strengthening, flexibility, manual/dry needling as indicated    Jarrell Laming, PT, DPT 07/15/24, 10:31 AM  [facility] [address], [address] [place], [place] [address] Phone # [phone] Fax [phone]

## 2024-07-15 NOTE — Patient Instructions (Signed)
 Thank you for attending your appointment today.  -- We did not make any medication changes today. Please continue medications as prescribed. -- As discussed, once the results from your urine drug screen come back we will start methylphenidate ER 18 mg daily.  Please do not make any changes to medications without first discussing with your provider. If you are experiencing a psychiatric emergency, please call 911 or present to your nearest emergency department. Additional crisis, medication management, and therapy resources are included below.  [facility]  [address] [place], [place] [address] [phone] WALK-IN URGENT CARE 24/7 FOR ANYONE [address], [place], [place]  [phone] Fax: [phone] [email or web address] *Interpreters available *Accepts all insurance and uninsured for Urgent Care needs *Accepts Medicaid and uninsured for outpatient treatment (below)      ONLY FOR [facility]  Below:    Outpatient New Patient Assessment/Therapy Walk-ins:        Monday, Wednesday, and Thursday 8am until slots are full (first come, first served)                   New Patient Psychiatry/Medication Management        Monday-Friday 8am-11am (first come, first served)               For all walk-ins we ask that you arrive by 7:15am, because patients will be seen in the order of arrival.

## 2024-07-16 ENCOUNTER — Ambulatory Visit: Admitting: Physical Therapy

## 2024-07-17 LAB — DRUG SCREEN, URINE
Amphetamines, Urine: POSITIVE ng/mL — AB
Barbiturate screen, urine: NEGATIVE ng/mL
Benzodiazepine Quant, Ur: NEGATIVE ng/mL
Cannabinoid Quant, Ur: NEGATIVE ng/mL
Cocaine (Metab.): NEGATIVE ng/mL
Opiate Quant, Ur: NEGATIVE ng/mL
PCP Quant, Ur: NEGATIVE ng/mL

## 2024-07-20 ENCOUNTER — Ambulatory Visit (HOSPITAL_COMMUNITY): Payer: Self-pay | Admitting: Psychiatry

## 2024-07-22 ENCOUNTER — Ambulatory Visit (HOSPITAL_COMMUNITY): Admitting: Mental Health

## 2024-07-22 ENCOUNTER — Ambulatory Visit: Attending: Orthopedic Surgery | Admitting: Physical Therapy

## 2024-07-22 ENCOUNTER — Encounter (HOSPITAL_COMMUNITY): Payer: Self-pay

## 2024-07-22 ENCOUNTER — Encounter: Payer: Self-pay | Admitting: Physical Therapy

## 2024-07-22 DIAGNOSIS — R262 Difficulty in walking, not elsewhere classified: Secondary | ICD-10-CM | POA: Insufficient documentation

## 2024-07-22 DIAGNOSIS — M6281 Muscle weakness (generalized): Secondary | ICD-10-CM | POA: Diagnosis present

## 2024-07-22 DIAGNOSIS — M25561 Pain in right knee: Secondary | ICD-10-CM | POA: Insufficient documentation

## 2024-07-22 NOTE — Therapy (Signed)
 OUTPATIENT PHYSICAL THERAPY TREATMENT NOTE   Patient Name: Lindsey Avila MRN: [id number] DOB:1976-05-07, 48 y.o., female Today's Date: 07/22/2024  END OF SESSION:  PT End of Session - 07/22/24 1236     Visit Number 5    Date for Recertification  08/21/24    Authorization Type [employer] Medicaid still awaiting auth    PT Start Time 1233    PT Stop Time 1315    PT Time Calculation (min) 42 min    Activity Tolerance Patient tolerated treatment well          Past Medical History:  Diagnosis Date   ADHD (attention deficit hyperactivity disorder) 05/2020   Allergy    Anemia    in remote past due to heavy periods   Anxiety    Chronic back pain    Depression 2009   prior with pregnancy   Encounter for routine gynecological examination    Dr. Marjorie Gull   GERD (gastroesophageal reflux disease)    IUD (intrauterine device) in place    IUD replaced 08/13/2018 with Mirena ; Dr. Marjorie Gull   Obesity    Recurrent urinary tract infection    no prior urology consult   Seasonal allergies    Skin anomaly    keratinosis   Wears glasses    Past Surgical History:  Procedure Laterality Date   CESAREAN SECTION  2009   KNEE ARTHROSCOPY     left, arthroscopic   Patient Active Problem List   Diagnosis Date Noted   Back pain 05/12/2024   Symptomatic mammary hypertrophy 05/12/2024   Neck pain 05/12/2024   MDD (major depressive disorder), recurrent, in partial remission 12/06/2023   Insomnia 09/20/2021   Attention deficit disorder 06/06/2020   Vitamin D  deficiency 10/07/2018   Obesity with serious comorbidity 08/23/2017    PCP: Carmelita Hutching, PA-C  REFERRING PROVIDER: Beverley Evalene BIRCH, MD  REFERRING DIAG: Right patellofemoral arthritis  THERAPY DIAG:  Right knee pain, unspecified chronicity  Muscle weakness (generalized)  Difficulty in walking, not elsewhere classified  Rationale for Evaluation and Treatment: Rehabilitation  ONSET DATE: Chronic, but began getting worse in  March 2025  SUBJECTIVE:   SUBJECTIVE STATEMENT: My knee is hurting today, with a change in direction, rushing. The pain is sharp and fast then aches.  I haven't been really regular with my ex's.  I need some simple, quick things that are easy to fit in.  PERTINENT HISTORY: ADHD, Left knee arthroscopic surgery in 2006  PAIN:  Are you having pain? Yes: NPRS scale: 4/10 Pain location: right lateral knee Pain description: when the pain happens, it is sharp Aggravating factors: certain pivoting motions of knee Relieving factors: rest  PRECAUTIONS: None  RED FLAGS: None   WEIGHT BEARING RESTRICTIONS: No  FALLS:  Has patient fallen in last 6 months? No  LIVING ENVIRONMENT: Lives with: lives with their son (49 years old) Lives in: House/apartment Stairs: one level home with 3 steps to enter Has following equipment at home: None  OCCUPATION: Immunologist  PLOF: Independent and Leisure: spend time with friends, walking, yoga  PATIENT GOALS: To be able to walk without pain and stronger so that I can maintain mobility as I age.  NEXT MD VISIT: as needed  OBJECTIVE:  Note: Objective measures were completed at Evaluation unless otherwise noted.  DIAGNOSTIC FINDINGS: Pt reports that she has had imaging that revealed bone on bone in her right knee  PATIENT SURVEYS:  LEFS  Extreme difficulty/unable (0), Quite a bit of  difficulty (1), Moderate difficulty (2), Little difficulty (3), No difficulty (4) Survey date:  07/01/24 07/15/24  Any of your usual work, housework or school activities 3 3  2. Usual hobbies, recreational or sporting activities 1 2  3. Getting into/out of the bath 4 2  4. Walking between rooms 3 4  5. Putting on socks/shoes 4 4  6. Squatting  1 1  7. Lifting an object, like a bag of groceries from the floor 2 3  8. Performing light activities around your home 3 3  9. Performing heavy activities around your home 1 2  10. Getting into/out of a car  2 4  11. Walking 2 blocks 2 3  12. Walking 1 mile 1 3  13. Going up/down 10 stairs (1 flight) 0 1  14. Standing for 1 hour 1 2  15.  sitting for 1 hour 3 4  16. Running on even ground 0 1  17. Running on uneven ground 0 0  18. Making sharp turns while running fast 0 0  19. Hopping  0 1  20. Rolling over in bed 3 4  Score total:  34/80 = 42.5% 47/80 = 58.8%     COGNITION: Overall cognitive status: Within functional limits for tasks assessed     SENSATION: WFL   MUSCLE LENGTH: Hamstrings and quads: slight tightness bilaterally   PALPATION: Tender to palpation around right knee  LOWER EXTREMITY ROM:  Active ROM Right eval Right 07/15/24 Left eval  Knee flexion 126 130 135  Knee extension -10 -3 0   (Blank rows = not tested)  LOWER EXTREMITY MMT:  Eval: Right hip strength grossly 4/5 throughout Left hip strength grossly 4 to 4+/5 throughout Right quad strength of 4+/5 Left quad strength 5-/5 Bilateral hamstring strength of 5-/5  LOWER EXTREMITY SPECIAL TESTS:  Knee special tests: Anterior drawer test: negative and Posterior drawer test: positive   FUNCTIONAL TESTS:  Eval: 5 times sit to stand: 8.75 sec Single leg stance:  right- 30 sec, left- 30 sec  07/07/2024:   6 min walk:  1441 ft with 2/10 pain  GAIT: Distance walked: >400 ft Assistive device utilized: None Level of assistance: Complete Independence Comments: Patient reports that she starts having lateral right knee pain with increased ambulation                                                                                                                                TREATMENT DATE:  11/5: KT right knee: 2 vertical strips that curve along medial and lateral knee and 1 horizontal strip across patellar tendon (pt has a box of KT at home but never used) instructed in removal in 2-3 days max 6 inch step ups 10x (2-3/10 pain)  Purple heavy power cord  TKE standing 20x (2-3/10 pain) Sit to stand  with purple power band around knee 3x; painful so modified with 1/4 squats  10x WB on right  with left 4 ways standing 3 sets of 5 verbal cues to avoid knee hyperextension (Added to HEP- see below) Single leg heel raises 20x (Added to HEP- see below) WB on right with divers forward with cone taps side to side 4 rounds of 5 (Added to HEP- see below)   07/15/2024: Recumbent bike level 3 x5 min with PT present to assess status Seated hamstring stretch 2x20 sec bilat Lower Extremity Functional Scale 58.8% Seated LAQ with ball squeeze and 3# ankle weight 2x10 bilat Standing hamstring curl with 3# ankle weights 2x10 bilat Fwd high marching with 3# ankle weights 4x15 ft Side stepping with yellow loop 2x10 ft bilat Standing calf stretch leaning against wall 2x20 sec bilat FWD step up to march on 6 step 2x5 bilat Standing Y taps 2x5 bilat   07/09/2024: Nu-step L4 5 min while discussing status At the stairs 2nd step hip flexor, gastroc and quadratus lumborum muscle lengthening 3 sets of 5 right/left: Rocking forward and back Rocking forward and back with arm elevation Rocking forward and back with arm reach up and over At the stairs 2nd step hamstring stretch 5x right/left Right TKE medium pink 10x right/left Single leg heel raises 10x right/left  Double green band in crouch/hip hinge hip abduction and extension diagonals 2x10  Supine HS curls with green physioball 10x with small curls  2 inch step downs with mirror feedback for patellofemoral alignment (maintain space b/w knees) 2 sets of 5 Sit to stand with space b/w knees 8x Hip abduction at the wall into the Pilates ball 5 sec hold 8x right/left (better activation of glutes) RPE 2/10    07/07/2024: Recumbent bike level 3 x5 min with PT present to assess status Seated hamstring stretch 2x20 sec bilat Seated LAQ with 2# ankle weights 2x10 bilat (with slow eccentric phase) Standing at barre with 2# ankle weights:  hip abduction, hip  extension, heel/toe raise, hamstring curl.  2x10 each bilat Standing calf stretch leaning against wall 2x20 sec bilat Standing quad stretch 2x20 sec bilat 6 min walk:  1441 ft with 2/10 pain   PATIENT EDUCATION:  Education details: Issued HEP Person educated: Patient Education method: Explanation, Demonstration, and Handouts Education comprehension: verbalized understanding and returned demonstration  HOME EXERCISE PROGRAM: Access Code: [email or web address] URL: [email or web address] Date: 07/22/2024 Prepared by: Glade Pesa  Exercises - Seated Table Hamstring Stretch  - 1 x daily - 7 x weekly - 4 reps - 20 sec hold - Quadriceps Stretch with Chair  - 1 x daily - 7 x weekly - 4 reps - 20 sec hold - Seated Long Arc Quad  - 1 x daily - 7 x weekly - 2 sets - 10 reps - Seated Long Arc Quad (Mirrored)  - 1 x daily - 7 x weekly - 2 sets - 10 reps - Gastroc Stretch on Wall  - 1 x daily - 7 x weekly - 4 reps - 20 sec hold - Heel Toe Raises with Counter Support  - 1 x daily - 7 x weekly - 2 sets - 10 reps - Standing Hip Abduction with Counter Support  - 1 x daily - 7 x weekly - 2 sets - 10 reps - Standing Hip Abduction with Counter Support (Mirrored)  - 1 x daily - 7 x weekly - 2 sets - 10 reps - Standing Hip Extension with Counter Support  - 1 x daily - 7 x weekly - 2 sets - 10 reps - Standing Hip Extension with Counter Support (  Mirrored)  - 1 x daily - 7 x weekly - 2 sets - 10 reps - Standing Knee Flexion with Counter Support  - 1 x daily - 7 x weekly - 2 sets - 10 reps - Standing Knee Flexion with Counter Support (Mirrored)  - 1 x daily - 7 x weekly - 2 sets - 10 reps - Standing Single Leg Heel Raise  - 1 x daily - 7 x weekly - 1 sets - 10 reps - Single Leg Balance with Clock Reach  - 1 x daily - 7 x weekly - 3 sets - 5 reps - The Diver  - 1 x daily - 7 x weekly - 3 sets - 5 reps  ASSESSMENT:  CLINICAL IMPRESSION: Trial of KT tape for knee proprioceptive support with ex's secondary  to increased pain on arrival today.  Dynamic strengthening (step ups, mini squats and TKES) all painful 3/10.  She responded well to static strengthening with focus on neutral knee position rather than hyperextension.  She is having trouble with completing all of her HEP due to time constraints and has upcoming travel plans.  We updated her HEP with 3 ex's to focus on to maintain neutral knee alignment. Therapist providing verbal cues to optimize technique with  exercises in order to achieve the greatest benefit.     OBJECTIVE IMPAIRMENTS: difficulty walking, decreased ROM, decreased strength, increased muscle spasms, impaired flexibility, and pain.   ACTIVITY LIMITATIONS: carrying, lifting, standing, stairs, and locomotion level  PARTICIPATION LIMITATIONS: cleaning and community activity  PERSONAL FACTORS: Time since onset of injury/illness/exacerbation and 1-2 comorbidities: ADHD, left knee arthroscopic surgery in 2006 are also affecting patient's functional outcome.   REHAB POTENTIAL: Good  CLINICAL DECISION MAKING: Stable/uncomplicated  EVALUATION COMPLEXITY: Low   GOALS: Goals reviewed with patient? Yes  SHORT TERM GOALS: Target date: 07/24/2024 Patient will be independent with initial HEP. Baseline: Goal status: Met on 07/15/24  2.  Patient will participate in a 6 minute walk test to establish baseline. Baseline:  Goal status: Met on 07/07/2024  3.  Patient will increase right knee extension A/ROM to -5 degrees from full extension. Baseline:  Goal status: Met on 07/15/24   LONG TERM GOALS: Target date: 08/23/2024  Patient will be independent with advanced HEP to allow for self progression after discharge. Baseline:  Goal status: Ongoing  2.  Patient will improve Lower Extremity Functional Scale (LEFS) to at least 70% to demonstrate improved functional mobility. Baseline: 42.5% Goal status: Ongoing (58.8% on 07/15/24)  3.  Patient will increase bilateral LE strength  to [facility] to allow her to navigate stairs with reciprocal pattern. Baseline:  Goal status: Ongoing  4.  Patient will report being able to ambulate greater than 20 minutes without increased knee pain. Baseline:  Goal status: Ongoing  5.  Patient will improve right knee A/ROM to [facility] to allow her to demonstrate improved knee mechanics during stairs and ambulation. Baseline: -10 to 126 degrees Goal status: Ongoing (see above)     PLAN:  PT FREQUENCY: 2x/week  PT DURATION: 8 weeks  PLANNED INTERVENTIONS: 97164- PT Re-evaluation, 97750- Physical Performance Testing, 97110-Therapeutic exercises, 97530- Therapeutic activity, [id number]- Neuromuscular re-education, 97535- Self Care, [id number]- Manual therapy, [id number]- Gait training, [phone]- Canalith repositioning, [id number]- Aquatic Therapy, H9716- Electrical stimulation (unattended), [phone]- Electrical stimulation (manual), [id number]- Vasopneumatic device, [id number]- Ultrasound, [id number]- Traction (mechanical), [id number]- Ionotophoresis 4mg /ml Dexamethasone, 79439 (1-2 muscles), 20561 (3+ muscles)- Dry Needling, Patient/Family education, Balance training, Stair training, Taping, Joint mobilization, Joint manipulation, Spinal manipulation,  Spinal mobilization, Vestibular training, Cryotherapy, and Moist heat  PLAN FOR NEXT SESSION: upcoming trip to [place];  assess response to KT and knee stability ex's; Assess and progress HEP as indicated, strengthening, flexibility, manual/dry needling as indicated    Glade Pesa, PT 07/22/24 2:19 PM Phone: [phone] Fax: [phone]  [facility] [address] [place], [place] [address] Phone # [phone] Fax [phone]

## 2024-07-23 ENCOUNTER — Encounter (HOSPITAL_COMMUNITY): Payer: Self-pay

## 2024-07-23 MED ORDER — METHYLPHENIDATE HCL ER 18 MG PO TB24: 18.0000 mg | ORAL_TABLET | Freq: Every morning | ORAL | 0 refills | Status: AC

## 2024-07-23 MED ORDER — METHYLPHENIDATE HCL ER 18 MG PO TB24: 18.0000 mg | ORAL_TABLET | Freq: Every morning | ORAL | 0 refills | Status: DC

## 2024-07-23 NOTE — Addendum Note (Signed)
 Addended by: MERCY DOMINO A on: 07/23/2024 03:42 PM   Modules accepted: Orders

## 2024-07-23 NOTE — Progress Notes (Signed)
 UDS results reviewed and wnl (appropriately positive for amphetamines given prescribed Adderall and otherwise negative). Given adverse effects to Adderall, will start methylphenidate CR 18 mg daily as previously discussed during appointment 07/15/24. Patient to be scheduled for next medication management appointment in 4 weeks in person to assess medication response and tolerability along with vitals check.   LAURAINE DELENA PUMMEL, MD 07/23/24

## 2024-07-24 ENCOUNTER — Ambulatory Visit: Admitting: Rehabilitative and Restorative Service Providers"

## 2024-07-24 ENCOUNTER — Encounter: Payer: Self-pay | Admitting: Rehabilitative and Restorative Service Providers"

## 2024-07-24 DIAGNOSIS — M6281 Muscle weakness (generalized): Secondary | ICD-10-CM

## 2024-07-24 DIAGNOSIS — M25561 Pain in right knee: Secondary | ICD-10-CM | POA: Diagnosis not present

## 2024-07-24 DIAGNOSIS — R262 Difficulty in walking, not elsewhere classified: Secondary | ICD-10-CM

## 2024-07-24 NOTE — Therapy (Signed)
 OUTPATIENT PHYSICAL THERAPY TREATMENT NOTE   Patient Name: Lindsey Avila MRN: [id number] DOB:[birth year], 48 y.o., female Today's Date: 07/24/2024  END OF SESSION:  PT End of Session - 07/24/24 1046     Visit Number 6    Date for Recertification  08/21/24    Authorization Type [employer] Medicaid still awaiting auth    PT Start Time 1045    PT Stop Time 1125    PT Time Calculation (min) 40 min    Activity Tolerance Patient tolerated treatment well    Behavior During Therapy [facility] for tasks assessed/performed          Past Medical History:  Diagnosis Date   ADHD (attention deficit hyperactivity disorder) 05/2020   Allergy    Anemia    in remote past due to heavy periods   Anxiety    Chronic back pain    Depression 2009   prior with pregnancy   Encounter for routine gynecological examination    Dr. Marjorie Gull   GERD (gastroesophageal reflux disease)    IUD (intrauterine device) in place    IUD replaced 08/13/2018 with Mirena ; Dr. Marjorie Gull   Obesity    Recurrent urinary tract infection    no prior urology consult   Seasonal allergies    Skin anomaly    keratinosis   Wears glasses    Past Surgical History:  Procedure Laterality Date   CESAREAN SECTION  2009   KNEE ARTHROSCOPY     left, arthroscopic   Patient Active Problem List   Diagnosis Date Noted   Back pain 05/12/2024   Symptomatic mammary hypertrophy 05/12/2024   Neck pain 05/12/2024   MDD (major depressive disorder), recurrent, in partial remission 12/06/2023   Insomnia 09/20/2021   Attention deficit disorder 06/06/2020   Vitamin D  deficiency 10/07/2018   Obesity with serious comorbidity 08/23/2017    PCP: Carmelita Hutching, PA-C  REFERRING PROVIDER: Beverley Evalene BIRCH, MD  REFERRING DIAG: Right patellofemoral arthritis  THERAPY DIAG:  Right knee pain, unspecified chronicity  Muscle weakness (generalized)  Difficulty in walking, not elsewhere classified  Rationale for Evaluation and Treatment:  Rehabilitation  ONSET DATE: Chronic, but began getting worse in March 2025  SUBJECTIVE:   SUBJECTIVE STATEMENT: Patient reports that she walked yesterday, but did not do any of the exercises.  States that she just did yoga prior to arrival, so her knee is having some increased pain.  Patient reports that she still has not spoken with the ortho office about how to use her brace properly.  PERTINENT HISTORY: ADHD, Left knee arthroscopic surgery in 2006  PAIN:  Are you having pain? Yes: NPRS scale: 4-5/10 Pain location: right lateral knee Pain description: when the pain happens, it is sharp Aggravating factors: certain pivoting motions of knee Relieving factors: rest  PRECAUTIONS: None  RED FLAGS: None   WEIGHT BEARING RESTRICTIONS: No  FALLS:  Has patient fallen in last 6 months? No  LIVING ENVIRONMENT: Lives with: lives with their son (57 years old) Lives in: House/apartment Stairs: one level home with 3 steps to enter Has following equipment at home: None  OCCUPATION: Immunologist  PLOF: Independent and Leisure: spend time with friends, walking, yoga  PATIENT GOALS: To be able to walk without pain and stronger so that I can maintain mobility as I age.  NEXT MD VISIT: as needed  OBJECTIVE:  Note: Objective measures were completed at Evaluation unless otherwise noted.  DIAGNOSTIC FINDINGS: Pt reports that she has had imaging that  revealed bone on bone in her right knee  PATIENT SURVEYS:  LEFS  Extreme difficulty/unable (0), Quite a bit of difficulty (1), Moderate difficulty (2), Little difficulty (3), No difficulty (4) Survey date:  07/01/24 07/15/24  Any of your usual work, housework or school activities 3 3  2. Usual hobbies, recreational or sporting activities 1 2  3. Getting into/out of the bath 4 2  4. Walking between rooms 3 4  5. Putting on socks/shoes 4 4  6. Squatting  1 1  7. Lifting an object, like a bag of groceries from the floor 2  3  8. Performing light activities around your home 3 3  9. Performing heavy activities around your home 1 2  10. Getting into/out of a car 2 4  11. Walking 2 blocks 2 3  12. Walking 1 mile 1 3  13. Going up/down 10 stairs (1 flight) 0 1  14. Standing for 1 hour 1 2  15.  sitting for 1 hour 3 4  16. Running on even ground 0 1  17. Running on uneven ground 0 0  18. Making sharp turns while running fast 0 0  19. Hopping  0 1  20. Rolling over in bed 3 4  Score total:  34/80 = 42.5% 47/80 = 58.8%     COGNITION: Overall cognitive status: Within functional limits for tasks assessed     SENSATION: WFL   MUSCLE LENGTH: Hamstrings and quads: slight tightness bilaterally   PALPATION: Tender to palpation around right knee  LOWER EXTREMITY ROM:  Active ROM Right eval Right 07/15/24 Left eval  Knee flexion 126 130 135  Knee extension -10 -3 0   (Blank rows = not tested)  LOWER EXTREMITY MMT:  Eval: Right hip strength grossly 4/5 throughout Left hip strength grossly 4 to 4+/5 throughout Right quad strength of 4+/5 Left quad strength 5-/5 Bilateral hamstring strength of 5-/5  LOWER EXTREMITY SPECIAL TESTS:  Knee special tests: Anterior drawer test: negative and Posterior drawer test: positive   FUNCTIONAL TESTS:  Eval: 5 times sit to stand: 8.75 sec Single leg stance:  right- 30 sec, left- 30 sec  07/07/2024:   6 min walk:  1441 ft with 2/10 pain  GAIT: Distance walked: >400 ft Assistive device utilized: None Level of assistance: Complete Independence Comments: Patient reports that she starts having lateral right knee pain with increased ambulation                                                                                                                                TREATMENT DATE:   07/24/2024: Standing rocker board for DF/PF x1 min Standing balance on rocker board (lateral position) x1 min Forward T x10 bilat (with cuing for maintaining level  hips) Standing alt toe tap to 2 cones x10 bilat Standing hamstring curl with 3# ankle weights x1 min Standing high march with 3# ankle weights x1 min Standing single  leg heel raise 2x10 bilat Leg Press (seat at 7) 70# 2x8 (cuing for proper knee position and to not lock knees) 4 way resisted ambulation with 10# cable pulley x3 each direction Single leg Y floor tap x5 bilat   11/5: KT right knee: 2 vertical strips that curve along medial and lateral knee and 1 horizontal strip across patellar tendon (pt has a box of KT at home but never used) instructed in removal in 2-3 days max 6 inch step ups 10x (2-3/10 pain)  Purple heavy power cord  TKE standing 20x (2-3/10 pain) Sit to stand with purple power band around knee 3x; painful so modified with 1/4 squats  10x WB on right with left 4 ways standing 3 sets of 5 verbal cues to avoid knee hyperextension (Added to HEP- see below) Single leg heel raises 20x (Added to HEP- see below) WB on right with divers forward with cone taps side to side 4 rounds of 5 (Added to HEP- see below)   07/15/2024: Recumbent bike level 3 x5 min with PT present to assess status Seated hamstring stretch 2x20 sec bilat Lower Extremity Functional Scale 58.8% Seated LAQ with ball squeeze and 3# ankle weight 2x10 bilat Standing hamstring curl with 3# ankle weights 2x10 bilat Fwd high marching with 3# ankle weights 4x15 ft Side stepping with yellow loop 2x10 ft bilat Standing calf stretch leaning against wall 2x20 sec bilat FWD step up to march on 6 step 2x5 bilat Standing Y taps 2x5 bilat   PATIENT EDUCATION:  Education details: Issued HEP Person educated: Patient Education method: Explanation, Demonstration, and Handouts Education comprehension: verbalized understanding and returned demonstration  HOME EXERCISE PROGRAM:  Access Code: [email or web address] URL: [email or web address] Date: 07/22/2024 Prepared by: Glade Pesa  Exercises - Seated  Table Hamstring Stretch  - 1 x daily - 7 x weekly - 4 reps - 20 sec hold - Quadriceps Stretch with Chair  - 1 x daily - 7 x weekly - 4 reps - 20 sec hold - Seated Long Arc Quad  - 1 x daily - 7 x weekly - 2 sets - 10 reps - Seated Long Arc Quad (Mirrored)  - 1 x daily - 7 x weekly - 2 sets - 10 reps - Gastroc Stretch on Wall  - 1 x daily - 7 x weekly - 4 reps - 20 sec hold - Heel Toe Raises with Counter Support  - 1 x daily - 7 x weekly - 2 sets - 10 reps - Standing Hip Abduction with Counter Support  - 1 x daily - 7 x weekly - 2 sets - 10 reps - Standing Hip Abduction with Counter Support (Mirrored)  - 1 x daily - 7 x weekly - 2 sets - 10 reps - Standing Hip Extension with Counter Support  - 1 x daily - 7 x weekly - 2 sets - 10 reps - Standing Hip Extension with Counter Support (Mirrored)  - 1 x daily - 7 x weekly - 2 sets - 10 reps - Standing Knee Flexion with Counter Support  - 1 x daily - 7 x weekly - 2 sets - 10 reps - Standing Knee Flexion with Counter Support (Mirrored)  - 1 x daily - 7 x weekly - 2 sets - 10 reps - Standing Single Leg Heel Raise  - 1 x daily - 7 x weekly - 1 sets - 10 reps - Single Leg Balance with Clock Reach  - 1 x daily - 7 x weekly -  3 sets - 5 reps - The Diver  - 1 x daily - 7 x weekly - 3 sets - 5 reps  ASSESSMENT:  CLINICAL IMPRESSION:  Ms Gwinner presents to skilled PT  reporting that she just left yoga and it was a good class.  Patient states that she has been walking, but still reports that she has not been doing her exercises.  Patient with some benefit to the Kinesiotape, but the relief only lasted one day.  Pt encouraged to reach out to her ortho office for assistance/training in proper application of the knee brace that she was provided.  Patient educated on maintaining proper posture and technique throughout the session and the importance of not increasing weights too much when using weight machines.  Patient educated on the importance of performing at least  some of her exercises daily to allow her knee to address her functional goals.  Patient would benefit from continued PT to progress towards goal related activities and overall, decreased pain.  OBJECTIVE IMPAIRMENTS: difficulty walking, decreased ROM, decreased strength, increased muscle spasms, impaired flexibility, and pain.   ACTIVITY LIMITATIONS: carrying, lifting, standing, stairs, and locomotion level  PARTICIPATION LIMITATIONS: cleaning and community activity  PERSONAL FACTORS: Time since onset of injury/illness/exacerbation and 1-2 comorbidities: ADHD, left knee arthroscopic surgery in 2006 are also affecting patient's functional outcome.   REHAB POTENTIAL: Good  CLINICAL DECISION MAKING: Stable/uncomplicated  EVALUATION COMPLEXITY: Low   GOALS: Goals reviewed with patient? Yes  SHORT TERM GOALS: Target date: 07/24/2024 Patient will be independent with initial HEP. Baseline: Goal status: Met on 07/15/24  2.  Patient will participate in a 6 minute walk test to establish baseline. Baseline:  Goal status: Met on 07/07/2024  3.  Patient will increase right knee extension A/ROM to -5 degrees from full extension. Baseline:  Goal status: Met on 07/15/24   LONG TERM GOALS: Target date: 08/23/2024  Patient will be independent with advanced HEP to allow for self progression after discharge. Baseline:  Goal status: Ongoing  2.  Patient will improve Lower Extremity Functional Scale (LEFS) to at least 70% to demonstrate improved functional mobility. Baseline: 42.5% Goal status: Ongoing (58.8% on 07/15/24)  3.  Patient will increase bilateral LE strength to [place] to allow her to navigate stairs with reciprocal pattern. Baseline:  Goal status: Ongoing  4.  Patient will report being able to ambulate greater than 20 minutes without increased knee pain. Baseline:  Goal status: Ongoing  5.  Patient will improve right knee A/ROM to [facility] to allow her to demonstrate improved knee  mechanics during stairs and ambulation. Baseline: -10 to 126 degrees Goal status: Ongoing (see above)     PLAN:  PT FREQUENCY: 2x/week  PT DURATION: 8 weeks  PLANNED INTERVENTIONS: 97164- PT Re-evaluation, 97750- Physical Performance Testing, 97110-Therapeutic exercises, 97530- Therapeutic activity, 97112- Neuromuscular re-education, 97535- Self Care, 02859- Manual therapy, [phone]- Gait training, [id number]- Canalith repositioning, [id number]- Aquatic Therapy, [phone]- Electrical stimulation (unattended), [phone]- Electrical stimulation (manual), [id number]- Vasopneumatic device, [id number]- Ultrasound, [id number]- Traction (mechanical), [id number]- Ionotophoresis 4mg /ml Dexamethasone, 20560 (1-2 muscles), 20561 (3+ muscles)- Dry Needling, Patient/Family education, Balance training, Stair training, Taping, Joint mobilization, Joint manipulation, Spinal manipulation, Spinal mobilization, Vestibular training, Cryotherapy, and Moist heat  PLAN FOR NEXT SESSION: upcoming trip to [place]; Assess and progress HEP as indicated, strengthening, flexibility, manual/dry needling as indicated, KT tape if indicated   Jarrell Laming, PT, DPT 07/24/24, 11:54 AM  [facility] [address] [place], [place] [address] Phone #  [phone] Fax [phone]

## 2024-07-28 ENCOUNTER — Ambulatory Visit: Admitting: Rehabilitative and Restorative Service Providers"

## 2024-07-30 ENCOUNTER — Encounter: Payer: Self-pay | Admitting: Rehabilitative and Restorative Service Providers"

## 2024-07-30 ENCOUNTER — Ambulatory Visit: Admitting: Rehabilitative and Restorative Service Providers"

## 2024-07-30 DIAGNOSIS — M25561 Pain in right knee: Secondary | ICD-10-CM | POA: Diagnosis not present

## 2024-07-30 DIAGNOSIS — R262 Difficulty in walking, not elsewhere classified: Secondary | ICD-10-CM

## 2024-07-30 DIAGNOSIS — M6281 Muscle weakness (generalized): Secondary | ICD-10-CM

## 2024-07-30 NOTE — Therapy (Addendum)
 OUTPATIENT PHYSICAL THERAPY TREATMENT NOTE   Patient Name: Journei Thomassen MRN: [id number] DOB:06-04-1976, 48 y.o., female Today's Date: 07/30/2024  END OF SESSION:  PT End of Session - 07/30/24 1104     Visit Number 7    Date for Recertification  08/21/24    Authorization Type [employer] (07/20/24- 08/21/24)    PT Start Time 1102    PT Stop Time 1140    PT Time Calculation (min) 38 min    Activity Tolerance Patient tolerated treatment well    Behavior During Therapy WFL for tasks assessed/performed          Past Medical History:  Diagnosis Date   ADHD (attention deficit hyperactivity disorder) 05/2020   Allergy    Anemia    in remote past due to heavy periods   Anxiety    Chronic back pain    Depression 2009   prior with pregnancy   Encounter for routine gynecological examination    Dr. Marjorie Gull   GERD (gastroesophageal reflux disease)    IUD (intrauterine device) in place    IUD replaced 08/13/2018 with Mirena ; Dr. Marjorie Gull   Obesity    Recurrent urinary tract infection    no prior urology consult   Seasonal allergies    Skin anomaly    keratinosis   Wears glasses    Past Surgical History:  Procedure Laterality Date   CESAREAN SECTION  2009   KNEE ARTHROSCOPY     left, arthroscopic   Patient Active Problem List   Diagnosis Date Noted   Back pain 05/12/2024   Symptomatic mammary hypertrophy 05/12/2024   Neck pain 05/12/2024   MDD (major depressive disorder), recurrent, in partial remission 12/06/2023   Insomnia 09/20/2021   Attention deficit disorder 06/06/2020   Vitamin D  deficiency 10/07/2018   Obesity with serious comorbidity 08/23/2017    PCP: Carmelita Hutching, PA-C  REFERRING PROVIDER: Beverley Evalene BIRCH, MD  REFERRING DIAG: Right patellofemoral arthritis  THERAPY DIAG:  Right knee pain, unspecified chronicity  Muscle weakness (generalized)  Difficulty in walking, not elsewhere classified  Rationale for Evaluation and Treatment:  Rehabilitation  ONSET DATE: Chronic, but began getting worse in March 2025  SUBJECTIVE:   SUBJECTIVE STATEMENT: Patient reports she went on her trip and was able to do a lot of walking.  PERTINENT HISTORY: ADHD, Left knee arthroscopic surgery in 2006  PAIN:  Are you having pain? Yes: NPRS scale: 4/10 Pain location: right lateral knee Pain description: when the pain happens, it is sharp Aggravating factors: certain pivoting motions of knee Relieving factors: rest  PRECAUTIONS: None  RED FLAGS: None   WEIGHT BEARING RESTRICTIONS: No  FALLS:  Has patient fallen in last 6 months? No  LIVING ENVIRONMENT: Lives with: lives with their son (48 years old) Lives in: House/apartment Stairs: one level home with 3 steps to enter Has following equipment at home: None  OCCUPATION: Immunologist  PLOF: Independent and Leisure: spend time with friends, walking, yoga  PATIENT GOALS: To be able to walk without pain and stronger so that I can maintain mobility as I age.  NEXT MD VISIT: as needed  OBJECTIVE:  Note: Objective measures were completed at Evaluation unless otherwise noted.  DIAGNOSTIC FINDINGS: Pt reports that she has had imaging that revealed bone on bone in her right knee  PATIENT SURVEYS:  LEFS  Extreme difficulty/unable (0), Quite a bit of difficulty (1), Moderate difficulty (2), Little difficulty (3), No difficulty (4) Survey date:  07/01/24 07/15/24  Any of your usual work, housework or school activities 3 3  2. Usual hobbies, recreational or sporting activities 1 2  3. Getting into/out of the bath 4 2  4. Walking between rooms 3 4  5. Putting on socks/shoes 4 4  6. Squatting  1 1  7. Lifting an object, like a bag of groceries from the floor 2 3  8. Performing light activities around your home 3 3  9. Performing heavy activities around your home 1 2  10. Getting into/out of a car 2 4  11. Walking 2 blocks 2 3  12. Walking 1 mile 1 3  13.  Going up/down 10 stairs (1 flight) 0 1  14. Standing for 1 hour 1 2  15.  sitting for 1 hour 3 4  16. Running on even ground 0 1  17. Running on uneven ground 0 0  18. Making sharp turns while running fast 0 0  19. Hopping  0 1  20. Rolling over in bed 3 4  Score total:  34/80 = 42.5% 47/80 = 58.8%     COGNITION: Overall cognitive status: Within functional limits for tasks assessed     SENSATION: WFL   MUSCLE LENGTH: Hamstrings and quads: slight tightness bilaterally   PALPATION: Tender to palpation around right knee  LOWER EXTREMITY ROM:  Active ROM Right eval Right 07/15/24 Left eval  Knee flexion 126 130 135  Knee extension -10 -3 0   (Blank rows = not tested)  LOWER EXTREMITY MMT:  Eval: Right hip strength grossly 4/5 throughout Left hip strength grossly 4 to 4+/5 throughout Right quad strength of 4+/5 Left quad strength 5-/5 Bilateral hamstring strength of 5-/5  LOWER EXTREMITY SPECIAL TESTS:  Knee special tests: Anterior drawer test: negative and Posterior drawer test: positive   FUNCTIONAL TESTS:  Eval: 5 times sit to stand: 8.75 sec Single leg stance:  right- 30 sec, left- 30 sec  07/07/2024:   6 min walk:  1441 ft with 2/10 pain  GAIT: Distance walked: >400 ft Assistive device utilized: None Level of assistance: Complete Independence Comments: Patient reports that she starts having lateral right knee pain with increased ambulation                                                                                                                                TREATMENT DATE:   07/30/2024: Nustep level 5 x6 min with PT present to discuss status Standing hamstring stretch 2x20 sec bilat Standing hip abduction isometric into ball on wall (with bent knee) x10 bilat Seated hip abduction clamshells with yellow loop 2x10 Standing rocker board for DF/PF x1 min Standing balance on rocker board in lateral position x1 min Leg Press (seat at 7) 70#  2x10 (cuing for proper knee position and to not lock knees) FWD lunge onto bosu x10 bilat   07/24/2024: Standing rocker board for DF/PF x1 min Standing balance on rocker board (  lateral position) x1 min Forward T x10 bilat (with cuing for maintaining level hips) Standing alt toe tap to 2 cones x10 bilat Standing hamstring curl with 3# ankle weights x1 min Standing high march with 3# ankle weights x1 min Standing single leg heel raise 2x10 bilat Leg Press (seat at 7) 70# 2x8 (cuing for proper knee position and to not lock knees) 4 way resisted ambulation with 10# cable pulley x3 each direction Single leg Y floor tap x5 bilat   11/5: KT right knee: 2 vertical strips that curve along medial and lateral knee and 1 horizontal strip across patellar tendon (pt has a box of KT at home but never used) instructed in removal in 2-3 days max 6 inch step ups 10x (2-3/10 pain)  Purple heavy power cord  TKE standing 20x (2-3/10 pain) Sit to stand with purple power band around knee 3x; painful so modified with 1/4 squats  10x WB on right with left 4 ways standing 3 sets of 5 verbal cues to avoid knee hyperextension (Added to HEP- see below) Single leg heel raises 20x (Added to HEP- see below) WB on right with divers forward with cone taps side to side 4 rounds of 5 (Added to HEP- see below)   PATIENT EDUCATION:  Education details: Issued HEP Person educated: Patient Education method: Explanation, Demonstration, and Handouts Education comprehension: verbalized understanding and returned demonstration  HOME EXERCISE PROGRAM:  Access Code: [email or web address] URL: [email or web address] Date: 07/22/2024 Prepared by: Glade Pesa  Exercises - Seated Table Hamstring Stretch  - 1 x daily - 7 x weekly - 4 reps - 20 sec hold - Quadriceps Stretch with Chair  - 1 x daily - 7 x weekly - 4 reps - 20 sec hold - Seated Long Arc Quad  - 1 x daily - 7 x weekly - 2 sets - 10 reps - Seated Long Arc Quad  (Mirrored)  - 1 x daily - 7 x weekly - 2 sets - 10 reps - Gastroc Stretch on Wall  - 1 x daily - 7 x weekly - 4 reps - 20 sec hold - Heel Toe Raises with Counter Support  - 1 x daily - 7 x weekly - 2 sets - 10 reps - Standing Hip Abduction with Counter Support  - 1 x daily - 7 x weekly - 2 sets - 10 reps - Standing Hip Abduction with Counter Support (Mirrored)  - 1 x daily - 7 x weekly - 2 sets - 10 reps - Standing Hip Extension with Counter Support  - 1 x daily - 7 x weekly - 2 sets - 10 reps - Standing Hip Extension with Counter Support (Mirrored)  - 1 x daily - 7 x weekly - 2 sets - 10 reps - Standing Knee Flexion with Counter Support  - 1 x daily - 7 x weekly - 2 sets - 10 reps - Standing Knee Flexion with Counter Support (Mirrored)  - 1 x daily - 7 x weekly - 2 sets - 10 reps - Standing Single Leg Heel Raise  - 1 x daily - 7 x weekly - 1 sets - 10 reps - Single Leg Balance with Clock Reach  - 1 x daily - 7 x weekly - 3 sets - 5 reps - The Diver  - 1 x daily - 7 x weekly - 3 sets - 5 reps  ASSESSMENT:  CLINICAL IMPRESSION:  Ms Archuleta presents to skilled PT  reporting that she was able to go  on her trip and she tried to use the stairs and work in exercises when she could.  Patient with difficulty with standing bent knee hip abduction ball press into wall.  Patient able to increase sets with leg press today.  Patient continues to require skilled PT to progress towards goal related activities.   OBJECTIVE IMPAIRMENTS: difficulty walking, decreased ROM, decreased strength, increased muscle spasms, impaired flexibility, and pain.   ACTIVITY LIMITATIONS: carrying, lifting, standing, stairs, and locomotion level  PARTICIPATION LIMITATIONS: cleaning and community activity  PERSONAL FACTORS: Time since onset of injury/illness/exacerbation and 1-2 comorbidities: ADHD, left knee arthroscopic surgery in 2006 are also affecting patient's functional outcome.   REHAB POTENTIAL: Good  CLINICAL  DECISION MAKING: Stable/uncomplicated  EVALUATION COMPLEXITY: Low   GOALS: Goals reviewed with patient? Yes  SHORT TERM GOALS: Target date: 07/24/2024 Patient will be independent with initial HEP. Baseline: Goal status: Met on 07/15/24  2.  Patient will participate in a 6 minute walk test to establish baseline. Baseline:  Goal status: Met on 07/07/2024  3.  Patient will increase right knee extension A/ROM to -5 degrees from full extension. Baseline:  Goal status: Met on 07/15/24   LONG TERM GOALS: Target date: 08/23/2024  Patient will be independent with advanced HEP to allow for self progression after discharge. Baseline:  Goal status: Ongoing  2.  Patient will improve Lower Extremity Functional Scale (LEFS) to at least 70% to demonstrate improved functional mobility. Baseline: 42.5% Goal status: Ongoing (58.8% on 07/15/24)  3.  Patient will increase bilateral LE strength to [facility] to allow her to navigate stairs with reciprocal pattern. Baseline:  Goal status: Ongoing  4.  Patient will report being able to ambulate greater than 20 minutes without increased knee pain. Baseline:  Goal status: Ongoing  5.  Patient will improve right knee A/ROM to [facility] to allow her to demonstrate improved knee mechanics during stairs and ambulation. Baseline: -10 to 126 degrees Goal status: Ongoing (see above)     PLAN:  PT FREQUENCY: 2x/week  PT DURATION: 8 weeks  PLANNED INTERVENTIONS: 97164- PT Re-evaluation, 97750- Physical Performance Testing, 97110-Therapeutic exercises, 97530- Therapeutic activity, 97112- Neuromuscular re-education, 97535- Self Care, [id number]- Manual therapy, [phone]- Gait training, [phone]- Canalith repositioning, [id number]- Aquatic Therapy, [phone]- Electrical stimulation (unattended), [phone]- Electrical stimulation (manual), [id number]- Vasopneumatic device, [id number]- Ultrasound, [id number]- Traction (mechanical), [id number]- Ionotophoresis 4mg /ml Dexamethasone, 20560 (1-2 muscles), [id number] (3+  muscles)- Dry Needling, Patient/Family education, Balance training, Stair training, Taping, Joint mobilization, Joint manipulation, Spinal manipulation, Spinal mobilization, Vestibular training, Cryotherapy, and Moist heat  PLAN FOR NEXT SESSION: upcoming trip to [place]; Assess and progress HEP as indicated, strengthening, flexibility, manual/dry needling as indicated, KT tape if indicated   Jarrell Laming, PT, DPT 07/30/24, 12:13 PM  [facility] [address], [address] [place], [place] [address] Phone # [phone] Fax [phone]

## 2024-08-03 ENCOUNTER — Ambulatory Visit (HOSPITAL_COMMUNITY): Admitting: Mental Health

## 2024-08-05 ENCOUNTER — Encounter: Payer: Self-pay | Admitting: Rehabilitative and Restorative Service Providers"

## 2024-08-05 ENCOUNTER — Ambulatory Visit: Admitting: Rehabilitative and Restorative Service Providers"

## 2024-08-05 DIAGNOSIS — M6281 Muscle weakness (generalized): Secondary | ICD-10-CM

## 2024-08-05 DIAGNOSIS — R262 Difficulty in walking, not elsewhere classified: Secondary | ICD-10-CM

## 2024-08-05 DIAGNOSIS — M25561 Pain in right knee: Secondary | ICD-10-CM | POA: Diagnosis not present

## 2024-08-05 NOTE — Therapy (Signed)
 OUTPATIENT PHYSICAL THERAPY TREATMENT NOTE   Patient Name: Lindsey Avila MRN: [id number] DOB:15-Feb-1976, 48 y.o., female Today's Date: 08/05/2024  END OF SESSION:  PT End of Session - 08/05/24 0935     Visit Number 8    Date for Recertification  08/21/24    Authorization Type [employer] (07/20/24- 08/21/24)    PT Start Time 0932    PT Stop Time 1010    PT Time Calculation (min) 38 min    Activity Tolerance Patient tolerated treatment well    Behavior During Therapy WFL for tasks assessed/performed          Past Medical History:  Diagnosis Date   ADHD (attention deficit hyperactivity disorder) 05/2020   Allergy    Anemia    in remote past due to heavy periods   Anxiety    Chronic back pain    Depression 2009   prior with pregnancy   Encounter for routine gynecological examination    Dr. Marjorie Gull   GERD (gastroesophageal reflux disease)    IUD (intrauterine device) in place    IUD replaced 08/13/2018 with Mirena ; Dr. Marjorie Gull   Obesity    Recurrent urinary tract infection    no prior urology consult   Seasonal allergies    Skin anomaly    keratinosis   Wears glasses    Past Surgical History:  Procedure Laterality Date   CESAREAN SECTION  2009   KNEE ARTHROSCOPY     left, arthroscopic   Patient Active Problem List   Diagnosis Date Noted   Back pain 05/12/2024   Symptomatic mammary hypertrophy 05/12/2024   Neck pain 05/12/2024   MDD (major depressive disorder), recurrent, in partial remission 12/06/2023   Insomnia 09/20/2021   Attention deficit disorder 06/06/2020   Vitamin D  deficiency 10/07/2018   Obesity with serious comorbidity 08/23/2017    PCP: Carmelita Hutching, PA-C  REFERRING PROVIDER: Beverley Evalene BIRCH, MD  REFERRING DIAG: Right patellofemoral arthritis  THERAPY DIAG:  Right knee pain, unspecified chronicity  Muscle weakness (generalized)  Difficulty in walking, not elsewhere classified  Rationale for Evaluation and Treatment:  Rehabilitation  ONSET DATE: Chronic, but began getting worse in March 2025  SUBJECTIVE:   SUBJECTIVE STATEMENT: Patient reports that she is doing well and having less pain today.  PERTINENT HISTORY: ADHD, Left knee arthroscopic surgery in 2006  PAIN:  Are you having pain? Yes: NPRS scale: 2-3/10 Pain location: right lateral knee Pain description: when the pain happens, it is sharp Aggravating factors: certain pivoting motions of knee Relieving factors: rest  PRECAUTIONS: None  RED FLAGS: None   WEIGHT BEARING RESTRICTIONS: No  FALLS:  Has patient fallen in last 6 months? No  LIVING ENVIRONMENT: Lives with: lives with their son (42 years old) Lives in: House/apartment Stairs: one level home with 3 steps to enter Has following equipment at home: None  OCCUPATION: Immunologist  PLOF: Independent and Leisure: spend time with friends, walking, yoga  PATIENT GOALS: To be able to walk without pain and stronger so that I can maintain mobility as I age.  NEXT MD VISIT: as needed  OBJECTIVE:  Note: Objective measures were completed at Evaluation unless otherwise noted.  DIAGNOSTIC FINDINGS: Pt reports that she has had imaging that revealed bone on bone in her right knee  PATIENT SURVEYS:  LEFS  Extreme difficulty/unable (0), Quite a bit of difficulty (1), Moderate difficulty (2), Little difficulty (3), No difficulty (4) Survey date:  07/01/24 07/15/24  Any of your  usual work, housework or school activities 3 3  2. Usual hobbies, recreational or sporting activities 1 2  3. Getting into/out of the bath 4 2  4. Walking between rooms 3 4  5. Putting on socks/shoes 4 4  6. Squatting  1 1  7. Lifting an object, like a bag of groceries from the floor 2 3  8. Performing light activities around your home 3 3  9. Performing heavy activities around your home 1 2  10. Getting into/out of a car 2 4  11. Walking 2 blocks 2 3  12. Walking 1 mile 1 3  13. Going  up/down 10 stairs (1 flight) 0 1  14. Standing for 1 hour 1 2  15.  sitting for 1 hour 3 4  16. Running on even ground 0 1  17. Running on uneven ground 0 0  18. Making sharp turns while running fast 0 0  19. Hopping  0 1  20. Rolling over in bed 3 4  Score total:  34/80 = 42.5% 47/80 = 58.8%     COGNITION: Overall cognitive status: Within functional limits for tasks assessed     SENSATION: WFL   MUSCLE LENGTH: Hamstrings and quads: slight tightness bilaterally   PALPATION: Tender to palpation around right knee  LOWER EXTREMITY ROM:  Active ROM Right eval Right 07/15/24 Left eval  Knee flexion 126 130 135  Knee extension -10 -3 0   (Blank rows = not tested)  LOWER EXTREMITY MMT:  Eval: Right hip strength grossly 4/5 throughout Left hip strength grossly 4 to 4+/5 throughout Right quad strength of 4+/5 Left quad strength 5-/5 Bilateral hamstring strength of 5-/5  LOWER EXTREMITY SPECIAL TESTS:  Knee special tests: Anterior drawer test: negative and Posterior drawer test: positive   FUNCTIONAL TESTS:  Eval: 5 times sit to stand: 8.75 sec Single leg stance:  right- 30 sec, left- 30 sec  07/07/2024:   6 min walk:  1441 ft with 2/10 pain  GAIT: Distance walked: >400 ft Assistive device utilized: None Level of assistance: Complete Independence Comments: Patient reports that she starts having lateral right knee pain with increased ambulation                                                                                                                                TREATMENT DATE:   08/05/2024: Nustep level 5 x6 min with PT present to discuss status Standing hamstring stretch on power plate (stretch setting 30 Hz) 2x30 sec bilat FWD step up on bosu x10 bilat FWD lunge onto bosu x10 bilat Standing single leg heel raise 2x10 bilat Standing high march with 3# ankle weights x20 bilat Standing hip abduction at barre with 3# ankle weights 2x10 bilat Leg  Press (seat at 7) 75# 2x8 Forward T x10 bilat 4 way resisted ambulation with 10# cable pulley x3 each direction (can go up in weight next visit)   07/30/2024:  Nustep level 5 x6 min with PT present to discuss status Standing hamstring stretch 2x20 sec bilat Standing hip abduction isometric into ball on wall (with bent knee) x10 bilat Seated hip abduction clamshells with yellow loop 2x10 Standing rocker board for DF/PF x1 min Standing balance on rocker board in lateral position x1 min Leg Press (seat at 7) 70# 2x10 (cuing for proper knee position and to not lock knees) FWD lunge onto bosu x10 bilat    07/24/2024: Standing rocker board for DF/PF x1 min Standing balance on rocker board (lateral position) x1 min Forward T x10 bilat (with cuing for maintaining level hips) Standing alt toe tap to 2 cones x10 bilat Standing hamstring curl with 3# ankle weights x1 min Standing high march with 3# ankle weights x1 min Standing single leg heel raise 2x10 bilat Leg Press (seat at 7) 70# 2x8 (cuing for proper knee position and to not lock knees) 4 way resisted ambulation with 10# cable pulley x3 each direction Single leg Y floor tap x5 bilat    PATIENT EDUCATION:  Education details: Issued HEP Person educated: Patient Education method: Explanation, Demonstration, and Handouts Education comprehension: verbalized understanding and returned demonstration  HOME EXERCISE PROGRAM:  Access Code: [email or web address] URL: [email or web address] Date: 07/22/2024 Prepared by: Glade Pesa  Exercises - Seated Table Hamstring Stretch  - 1 x daily - 7 x weekly - 4 reps - 20 sec hold - Quadriceps Stretch with Chair  - 1 x daily - 7 x weekly - 4 reps - 20 sec hold - Seated Long Arc Quad  - 1 x daily - 7 x weekly - 2 sets - 10 reps - Seated Long Arc Quad (Mirrored)  - 1 x daily - 7 x weekly - 2 sets - 10 reps - Gastroc Stretch on Wall  - 1 x daily - 7 x weekly - 4 reps - 20 sec hold - Heel Toe  Raises with Counter Support  - 1 x daily - 7 x weekly - 2 sets - 10 reps - Standing Hip Abduction with Counter Support  - 1 x daily - 7 x weekly - 2 sets - 10 reps - Standing Hip Abduction with Counter Support (Mirrored)  - 1 x daily - 7 x weekly - 2 sets - 10 reps - Standing Hip Extension with Counter Support  - 1 x daily - 7 x weekly - 2 sets - 10 reps - Standing Hip Extension with Counter Support (Mirrored)  - 1 x daily - 7 x weekly - 2 sets - 10 reps - Standing Knee Flexion with Counter Support  - 1 x daily - 7 x weekly - 2 sets - 10 reps - Standing Knee Flexion with Counter Support (Mirrored)  - 1 x daily - 7 x weekly - 2 sets - 10 reps - Standing Single Leg Heel Raise  - 1 x daily - 7 x weekly - 1 sets - 10 reps - Single Leg Balance with Clock Reach  - 1 x daily - 7 x weekly - 3 sets - 5 reps - The Diver  - 1 x daily - 7 x weekly - 3 sets - 5 reps  ASSESSMENT:  CLINICAL IMPRESSION:  Ms Greenly presents to skilled PT reporting that she is feeling better today.  Patient continues to progress with LE strengthening during session.  Patient able to progress with strengthening exercises in standing and able to tolerate slight increase in weight on leg press.  Patient with a better job  today noting her abilities and when she needs to slow down or stop an exercise.  Patient with improved stability on forward T noted today.  Patient continues to require skilled PT to progress towards goal related activities.  OBJECTIVE IMPAIRMENTS: difficulty walking, decreased ROM, decreased strength, increased muscle spasms, impaired flexibility, and pain.   ACTIVITY LIMITATIONS: carrying, lifting, standing, stairs, and locomotion level  PARTICIPATION LIMITATIONS: cleaning and community activity  PERSONAL FACTORS: Time since onset of injury/illness/exacerbation and 1-2 comorbidities: ADHD, left knee arthroscopic surgery in 2006 are also affecting patient's functional outcome.   REHAB POTENTIAL:  Good  CLINICAL DECISION MAKING: Stable/uncomplicated  EVALUATION COMPLEXITY: Low   GOALS: Goals reviewed with patient? Yes  SHORT TERM GOALS: Target date: 07/24/2024 Patient will be independent with initial HEP. Baseline: Goal status: Met on 07/15/24  2.  Patient will participate in a 6 minute walk test to establish baseline. Baseline:  Goal status: Met on 07/07/2024  3.  Patient will increase right knee extension A/ROM to -5 degrees from full extension. Baseline:  Goal status: Met on 07/15/24   LONG TERM GOALS: Target date: 08/23/2024  Patient will be independent with advanced HEP to allow for self progression after discharge. Baseline:  Goal status: Ongoing  2.  Patient will improve Lower Extremity Functional Scale (LEFS) to at least 70% to demonstrate improved functional mobility. Baseline: 42.5% Goal status: Ongoing (58.8% on 07/15/24)  3.  Patient will increase bilateral LE strength to [facility] to allow her to navigate stairs with reciprocal pattern. Baseline:  Goal status: Ongoing  4.  Patient will report being able to ambulate greater than 20 minutes without increased knee pain. Baseline:  Goal status: Ongoing  5.  Patient will improve right knee A/ROM to [facility] to allow her to demonstrate improved knee mechanics during stairs and ambulation. Baseline: -10 to 126 degrees Goal status: Ongoing (see above)     PLAN:  PT FREQUENCY: 2x/week  PT DURATION: 8 weeks  PLANNED INTERVENTIONS: 97164- PT Re-evaluation, 97750- Physical Performance Testing, 97110-Therapeutic exercises, 97530- Therapeutic activity, 97112- Neuromuscular re-education, 97535- Self Care, 02859- Manual therapy, [phone]- Gait training, [phone]- Canalith repositioning, [id number]- Aquatic Therapy, [phone]- Electrical stimulation (unattended), [phone]- Electrical stimulation (manual), [id number]- Vasopneumatic device, [id number]- Ultrasound, [id number]- Traction (mechanical), [id number]- Ionotophoresis 4mg /ml Dexamethasone, 20560 (1-2  muscles), 20561 (3+ muscles)- Dry Needling, Patient/Family education, Balance training, Stair training, Taping, Joint mobilization, Joint manipulation, Spinal manipulation, Spinal mobilization, Vestibular training, Cryotherapy, and Moist heat  PLAN FOR NEXT SESSION: Assess and progress HEP as indicated, strengthening, flexibility, manual/dry needling as indicated, KT tape if indicated   Jarrell Laming, PT, DPT 08/05/24, 1:43 PM  [facility] [address] [place], [place] [address] Phone # [phone] Fax [phone]

## 2024-08-07 ENCOUNTER — Ambulatory Visit: Admitting: Physical Therapy

## 2024-08-07 ENCOUNTER — Encounter: Payer: Self-pay | Admitting: Physical Therapy

## 2024-08-07 DIAGNOSIS — M25561 Pain in right knee: Secondary | ICD-10-CM

## 2024-08-07 DIAGNOSIS — R262 Difficulty in walking, not elsewhere classified: Secondary | ICD-10-CM

## 2024-08-07 DIAGNOSIS — M6281 Muscle weakness (generalized): Secondary | ICD-10-CM

## 2024-08-07 NOTE — Therapy (Signed)
 OUTPATIENT PHYSICAL THERAPY TREATMENT NOTE   Patient Name: Lindsey Avila MRN: [id number] DOB:09/06/1976, 48 y.o., female Today's Date: 08/07/2024  END OF SESSION:  PT End of Session - 08/07/24 1059     Visit Number 9    Number of Visits 10    Date for Recertification  08/21/24    Authorization Type [employer] (07/20/24- 08/21/24)  10 visits    PT Start Time 1100    PT Stop Time 1140    PT Time Calculation (min) 40 min    Activity Tolerance Patient tolerated treatment well          Past Medical History:  Diagnosis Date   ADHD (attention deficit hyperactivity disorder) 05/2020   Allergy    Anemia    in remote past due to heavy periods   Anxiety    Chronic back pain    Depression 2009   prior with pregnancy   Encounter for routine gynecological examination    Dr. Marjorie Gull   GERD (gastroesophageal reflux disease)    IUD (intrauterine device) in place    IUD replaced 08/13/2018 with Mirena ; Dr. Marjorie Gull   Obesity    Recurrent urinary tract infection    no prior urology consult   Seasonal allergies    Skin anomaly    keratinosis   Wears glasses    Past Surgical History:  Procedure Laterality Date   CESAREAN SECTION  2009   KNEE ARTHROSCOPY     left, arthroscopic   Patient Active Problem List   Diagnosis Date Noted   Back pain 05/12/2024   Symptomatic mammary hypertrophy 05/12/2024   Neck pain 05/12/2024   MDD (major depressive disorder), recurrent, in partial remission 12/06/2023   Insomnia 09/20/2021   Attention deficit disorder 06/06/2020   Vitamin D  deficiency 10/07/2018   Obesity with serious comorbidity 08/23/2017    PCP: Carmelita Hutching, PA-C  REFERRING PROVIDER: Beverley Evalene BIRCH, MD  REFERRING DIAG: Right patellofemoral arthritis  THERAPY DIAG:  Right knee pain, unspecified chronicity  Muscle weakness (generalized)  Difficulty in walking, not elsewhere classified  Rationale for Evaluation and Treatment: Rehabilitation  ONSET DATE:  Chronic, but began getting worse in March 2025  SUBJECTIVE:   SUBJECTIVE STATEMENT: Did a yoga class this morning so I'm warmed up.  It was less aggressive than last week's class. I'd like to work with some of the machines today [facility]    PERTINENT HISTORY: ADHD, Left knee arthroscopic surgery in 2006  PAIN:  Are you having pain? Yes: NPRS scale: 2-3/10 Pain location: right lateral knee Pain description: when the pain happens, it is sharp Aggravating factors: certain pivoting motions of knee Relieving factors: rest  PRECAUTIONS: None  RED FLAGS: None   WEIGHT BEARING RESTRICTIONS: No  FALLS:  Has patient fallen in last 6 months? No  LIVING ENVIRONMENT: Lives with: lives with their son (59 years old) Lives in: House/apartment Stairs: one level home with 3 steps to enter Has following equipment at home: None  OCCUPATION: Immunologist  PLOF: Independent and Leisure: spend time with friends, walking, yoga  PATIENT GOALS: To be able to walk without pain and stronger so that I can maintain mobility as I age.  NEXT MD VISIT: as needed  OBJECTIVE:  Note: Objective measures were completed at Evaluation unless otherwise noted.  DIAGNOSTIC FINDINGS: Pt reports that she has had imaging that revealed bone on bone in her right knee  PATIENT SURVEYS:  LEFS  Extreme difficulty/unable (0), Quite a bit of difficulty (1), Moderate difficulty (2), Little difficulty (3), No difficulty (4) Survey date:  07/01/24 07/15/24  Any of your usual work, housework or school activities 3 3  2. Usual hobbies, recreational or sporting activities 1 2  3. Getting into/out of the bath 4 2  4. Walking between rooms 3 4  5. Putting on socks/shoes 4 4  6. Squatting  1 1  7. Lifting an object, like a bag of groceries from the floor 2 3  8. Performing light activities around your home 3 3  9. Performing heavy activities around your home 1 2  10.  Getting into/out of a car 2 4  11. Walking 2 blocks 2 3  12. Walking 1 mile 1 3  13. Going up/down 10 stairs (1 flight) 0 1  14. Standing for 1 hour 1 2  15.  sitting for 1 hour 3 4  16. Running on even ground 0 1  17. Running on uneven ground 0 0  18. Making sharp turns while running fast 0 0  19. Hopping  0 1  20. Rolling over in bed 3 4  Score total:  34/80 = 42.5% 47/80 = 58.8%     COGNITION: Overall cognitive status: Within functional limits for tasks assessed     SENSATION: WFL   MUSCLE LENGTH: Hamstrings and quads: slight tightness bilaterally   PALPATION: Tender to palpation around right knee  LOWER EXTREMITY ROM:  Active ROM Right eval Right 07/15/24 Left eval  Knee flexion 126 130 135  Knee extension -10 -3 0   (Blank rows = not tested)  LOWER EXTREMITY MMT:  Eval: Right hip strength grossly 4/5 throughout Left hip strength grossly 4 to 4+/5 throughout Right quad strength of 4+/5 Left quad strength 5-/5 Bilateral hamstring strength of 5-/5  LOWER EXTREMITY SPECIAL TESTS:  Knee special tests: Anterior drawer test: negative and Posterior drawer test: positive   FUNCTIONAL TESTS:  Eval: 5 times sit to stand: 8.75 sec Single leg stance:  right- 30 sec, left- 30 sec  07/07/2024:   6 min walk:  1441 ft with 2/10 pain  GAIT: Distance walked: >400 ft Assistive device utilized: None Level of assistance: Complete Independence Comments: Patient reports that she starts having lateral right knee pain with increased ambulation                                                                                                                                TREATMENT DATE:  08/07/2024: Landy band pallof press SLS: press out, press up, stir the pot each way: 5 reps each right/left  SLS 5# KB reach toward to the knee then transition to overhead press 10x right/left Leg Press (seat at 6)cues for patellofemoral alignment 75# 15x; 35# 20x single leg right/left   Gait pattern assessment (encouraged arm swing, shoe wear down evident) 4 way resisted ambulation with 15# cable pulley  x5 forward, 20# backward, 15# side stepping 5x each (did 20# 3 reps pt wanted to challenge herself) Rockerboard ankle PF/DF Hip Matrix 45#: extension and abduction 10x each right/left (may increase weight next time)  08/05/2024: Nustep level 5 x6 min with PT present to discuss status Standing hamstring stretch on power plate (stretch setting 30 Hz) 2x30 sec bilat FWD step up on bosu x10 bilat FWD lunge onto bosu x10 bilat Standing single leg heel raise 2x10 bilat Standing high march with 3# ankle weights x20 bilat Standing hip abduction at barre with 3# ankle weights 2x10 bilat Leg Press (seat at 7) 75# 2x8 Forward T x10 bilat 4 way resisted ambulation with 10# cable pulley x3 each direction (can go up in weight next visit)   07/30/2024: Nustep level 5 x6 min with PT present to discuss status Standing hamstring stretch 2x20 sec bilat Standing hip abduction isometric into ball on wall (with bent knee) x10 bilat Seated hip abduction clamshells with yellow loop 2x10 Standing rocker board for DF/PF x1 min Standing balance on rocker board in lateral position x1 min Leg Press (seat at 7) 70# 2x10 (cuing for proper knee position and to not lock knees) FWD lunge onto bosu x10 bilat    07/24/2024: Standing rocker board for DF/PF x1 min Standing balance on rocker board (lateral position) x1 min Forward T x10 bilat (with cuing for maintaining level hips) Standing alt toe tap to 2 cones x10 bilat Standing hamstring curl with 3# ankle weights x1 min Standing high march with 3# ankle weights x1 min Standing single leg heel raise 2x10 bilat Leg Press (seat at 7) 70# 2x8 (cuing for proper knee position and to not lock knees) 4 way resisted ambulation with 10# cable pulley x3 each direction Single leg Y floor tap x5 bilat    PATIENT EDUCATION:  Education details: Issued  HEP Person educated: Patient Education method: Explanation, Demonstration, and Handouts Education comprehension: verbalized understanding and returned demonstration  HOME EXERCISE PROGRAM:  Access Code: [email or web address] URL: [email or web address] Date: 07/22/2024 Prepared by: Glade Pesa  Exercises - Seated Table Hamstring Stretch  - 1 x daily - 7 x weekly - 4 reps - 20 sec hold - Quadriceps Stretch with Chair  - 1 x daily - 7 x weekly - 4 reps - 20 sec hold - Seated Long Arc Quad  - 1 x daily - 7 x weekly - 2 sets - 10 reps - Seated Long Arc Quad (Mirrored)  - 1 x daily - 7 x weekly - 2 sets - 10 reps - Gastroc Stretch on Wall  - 1 x daily - 7 x weekly - 4 reps - 20 sec hold - Heel Toe Raises with Counter Support  - 1 x daily - 7 x weekly - 2 sets - 10 reps - Standing Hip Abduction with Counter Support  - 1 x daily - 7 x weekly - 2 sets - 10 reps - Standing Hip Abduction with Counter Support (Mirrored)  - 1 x daily - 7 x weekly - 2 sets - 10 reps - Standing Hip Extension with Counter Support  - 1 x daily - 7 x weekly - 2 sets - 10 reps - Standing Hip Extension with Counter Support (Mirrored)  - 1 x daily - 7 x weekly - 2 sets - 10 reps - Standing Knee Flexion with Counter Support  - 1 x daily - 7 x weekly - 2 sets - 10 reps - Standing Knee Flexion with Counter Support (  Mirrored)  - 1 x daily - 7 x weekly - 2 sets - 10 reps - Standing Single Leg Heel Raise  - 1 x daily - 7 x weekly - 1 sets - 10 reps - Single Leg Balance with Clock Reach  - 1 x daily - 7 x weekly - 3 sets - 5 reps - The Diver  - 1 x daily - 7 x weekly - 3 sets - 5 reps  ASSESSMENT:  CLINICAL IMPRESSION: Verbal cues for patellofemoral alignment including avoidance of knee hyperextension and valgus.  Added further challenge level of exercise by adding more dynamic movements while maintaining knee position and increased resistance with several ex's today.  Will discharge next visit to HEP.    OBJECTIVE  IMPAIRMENTS: difficulty walking, decreased ROM, decreased strength, increased muscle spasms, impaired flexibility, and pain.   ACTIVITY LIMITATIONS: carrying, lifting, standing, stairs, and locomotion level  PARTICIPATION LIMITATIONS: cleaning and community activity  PERSONAL FACTORS: Time since onset of injury/illness/exacerbation and 1-2 comorbidities: ADHD, left knee arthroscopic surgery in 2006 are also affecting patient's functional outcome.   REHAB POTENTIAL: Good  CLINICAL DECISION MAKING: Stable/uncomplicated  EVALUATION COMPLEXITY: Low   GOALS: Goals reviewed with patient? Yes  SHORT TERM GOALS: Target date: 07/24/2024 Patient will be independent with initial HEP. Baseline: Goal status: Met on 07/15/24  2.  Patient will participate in a 6 minute walk test to establish baseline. Baseline:  Goal status: Met on 07/07/2024  3.  Patient will increase right knee extension A/ROM to -5 degrees from full extension. Baseline:  Goal status: Met on 07/15/24   LONG TERM GOALS: Target date: 08/23/2024  Patient will be independent with advanced HEP to allow for self progression after discharge. Baseline:  Goal status: Ongoing  2.  Patient will improve Lower Extremity Functional Scale (LEFS) to at least 70% to demonstrate improved functional mobility. Baseline: 42.5% Goal status: Ongoing (58.8% on 07/15/24)  3.  Patient will increase bilateral LE strength to [facility] to allow her to navigate stairs with reciprocal pattern. Baseline:  Goal status: Ongoing  4.  Patient will report being able to ambulate greater than 20 minutes without increased knee pain. Baseline:  Goal status: Ongoing  5.  Patient will improve right knee A/ROM to [facility] to allow her to demonstrate improved knee mechanics during stairs and ambulation. Baseline: -10 to 126 degrees Goal status: Ongoing (see above)     PLAN:  PT FREQUENCY: 2x/week  PT DURATION: 8 weeks  PLANNED INTERVENTIONS: [id number]- PT  Re-evaluation, 97750- Physical Performance Testing, 97110-Therapeutic exercises, 97530- Therapeutic activity, 97112- Neuromuscular re-education, 97535- Self Care, 02859- Manual therapy, [phone]- Gait training, [phone]- Canalith repositioning, [id number]- Aquatic Therapy, [phone]- Electrical stimulation (unattended), [phone]- Electrical stimulation (manual), [id number]- Vasopneumatic device, [id number]- Ultrasound, [id number]- Traction (mechanical), [id number]- Ionotophoresis 4mg /ml Dexamethasone, 20560 (1-2 muscles), 20561 (3+ muscles)- Dry Needling, Patient/Family education, Balance training, Stair training, Taping, Joint mobilization, Joint manipulation, Spinal manipulation, Spinal mobilization, Vestibular training, Cryotherapy, and Moist heat  PLAN FOR NEXT SESSION: LEFS; plan for discharge next visit (end of authorization and pt states her insurance ends 11/30)  finalize HEP;  strengthening, flexibility   Glade Pesa, PT 08/07/24 11:38 AM Phone: [phone] Fax: [phone]  [facility] [address], [address] [place], [place] [address] Phone # [phone] Fax [phone]

## 2024-08-11 ENCOUNTER — Ambulatory Visit: Admitting: Rehabilitative and Restorative Service Providers"

## 2024-08-11 ENCOUNTER — Encounter: Payer: Self-pay | Admitting: Rehabilitative and Restorative Service Providers"

## 2024-08-11 DIAGNOSIS — M25561 Pain in right knee: Secondary | ICD-10-CM | POA: Diagnosis not present

## 2024-08-11 DIAGNOSIS — R262 Difficulty in walking, not elsewhere classified: Secondary | ICD-10-CM

## 2024-08-11 DIAGNOSIS — M6281 Muscle weakness (generalized): Secondary | ICD-10-CM

## 2024-08-11 NOTE — Therapy (Signed)
 OUTPATIENT PHYSICAL THERAPY TREATMENT NOTE AND DISCHARGE SUMMARY   Patient Name: Lindsey Avila MRN: [id number] DOB:[birth year], 48 y.o., female Today's Date: 08/11/2024  END OF SESSION:  PT End of Session - 08/11/24 1111     Visit Number 10    Date for Recertification  08/21/24    Authorization Type [employer] (07/20/24- 08/21/24)  10 visits    PT Start Time 1103    PT Stop Time 1141    PT Time Calculation (min) 38 min    Activity Tolerance Patient tolerated treatment well    Behavior During Therapy WFL for tasks assessed/performed          Past Medical History:  Diagnosis Date   ADHD (attention deficit hyperactivity disorder) 05/2020   Allergy    Anemia    in remote past due to heavy periods   Anxiety    Chronic back pain    Depression 2009   prior with pregnancy   Encounter for routine gynecological examination    Dr. Marjorie Gull   GERD (gastroesophageal reflux disease)    IUD (intrauterine device) in place    IUD replaced 08/13/2018 with Mirena ; Dr. Marjorie Gull   Obesity    Recurrent urinary tract infection    no prior urology consult   Seasonal allergies    Skin anomaly    keratinosis   Wears glasses    Past Surgical History:  Procedure Laterality Date   CESAREAN SECTION  2009   KNEE ARTHROSCOPY     left, arthroscopic   Patient Active Problem List   Diagnosis Date Noted   Back pain 05/12/2024   Symptomatic mammary hypertrophy 05/12/2024   Neck pain 05/12/2024   MDD (major depressive disorder), recurrent, in partial remission 12/06/2023   Insomnia 09/20/2021   Attention deficit disorder 06/06/2020   Vitamin D  deficiency 10/07/2018   Obesity with serious comorbidity 08/23/2017    PCP: Carmelita Hutching, PA-C  REFERRING PROVIDER: Beverley Evalene BIRCH, MD  REFERRING DIAG: Right patellofemoral arthritis  THERAPY DIAG:  Right knee pain, unspecified chronicity  Muscle weakness (generalized)  Difficulty in walking, not elsewhere classified  Rationale  for Evaluation and Treatment: Rehabilitation  ONSET DATE: Chronic, but began getting worse in March 2025  SUBJECTIVE:   SUBJECTIVE STATEMENT: Did a yoga class this morning so I'm warmed up.  It was less aggressive than last week's class. I'd like to work with some of the [facility] today [facility]    PERTINENT HISTORY: ADHD, Left knee arthroscopic surgery in 2006  PAIN:  Are you having pain? Yes: NPRS scale: 2-3/10 Pain location: right lateral knee Pain description: when the pain happens, it is sharp Aggravating factors: certain pivoting motions of knee Relieving factors: rest  PRECAUTIONS: None  RED FLAGS: None   WEIGHT BEARING RESTRICTIONS: No  FALLS:  Has patient fallen in last 6 months? No  LIVING ENVIRONMENT: Lives with: lives with their son (76 years old) Lives in: House/apartment Stairs: one level home with 3 steps to enter Has following equipment at home: None  OCCUPATION: Immunologist  PLOF: Independent and Leisure: spend time with friends, walking, yoga  PATIENT GOALS: To be able to walk without pain and stronger so that I can maintain mobility as I age.  NEXT MD VISIT: as needed  OBJECTIVE:  Note: Objective measures were completed at Evaluation unless otherwise noted.  DIAGNOSTIC FINDINGS: Pt reports that she has had imaging that revealed bone on bone in her  right knee  PATIENT SURVEYS:  LEFS  Extreme difficulty/unable (0), Quite a bit of difficulty (1), Moderate difficulty (2), Little difficulty (3), No difficulty (4) Survey date:  07/01/24 07/15/24 08/11/24  Any of your usual work, housework or school activities 3 3 4   2. Usual hobbies, recreational or sporting activities 1 2 3   3. Getting into/out of the bath 4 2 4   4. Walking between rooms 3 4 4   5. Putting on socks/shoes 4 4 4   6. Squatting  1 1 2   7. Lifting an object, like a bag of groceries from the floor 2 3 3   8. Performing light activities  around your home 3 3 4   9. Performing heavy activities around your home 1 2 3   10. Getting into/out of a car 2 4 4   11. Walking 2 blocks 2 3 3   12. Walking 1 mile 1 3 3   13. Going up/down 10 stairs (1 flight) 0 1 2  14. Standing for 1 hour 1 2 2   15.  sitting for 1 hour 3 4 4   16. Running on even ground 0 1 2  17. Running on uneven ground 0 0 1  18. Making sharp turns while running fast 0 0 1  19. Hopping  0 1 2  20. Rolling over in bed 3 4 4   Score total:  34/80 = 42.5% 47/80 = 58.8% 59/80 = 73.75%     COGNITION: Overall cognitive status: Within functional limits for tasks assessed     SENSATION: WFL   MUSCLE LENGTH: Hamstrings and quads: slight tightness bilaterally   PALPATION: Tender to palpation around right knee  LOWER EXTREMITY ROM:  Active ROM Right eval Right 07/15/24 Right 08/11/24 Left eval  Knee flexion 126 130 135 135  Knee extension -10 -3 0 0   (Blank rows = not tested)  LOWER EXTREMITY MMT:  Eval: Right hip strength grossly 4/5 throughout Left hip strength grossly 4 to 4+/5 throughout Right quad strength of 4+/5 Left quad strength 5-/5 Bilateral hamstring strength of 5-/5  LOWER EXTREMITY SPECIAL TESTS:  Knee special tests: Anterior drawer test: negative and Posterior drawer test: positive   FUNCTIONAL TESTS:  Eval: 5 times sit to stand: 8.75 sec Single leg stance:  right- 30 sec, left- 30 sec  07/07/2024:   6 min walk:  1441 ft with 2/10 pain  GAIT: Distance walked: >400 ft Assistive device utilized: None Level of assistance: Complete Independence Comments: Patient reports that she starts having lateral right knee pain with increased ambulation                                                                                                                                TREATMENT DATE:   08/11/2024: Nustep level 5 x6 min with PT present to discuss status Standing hamstring stretch with foot on 2nd step 2x20 sec bilat Lower  Extremity Functional Status = 73.75%  Leg Press (seat at  6) 75# x15 Single Leg Press 35# 2x10 bilat Hip Matrix 40#:  hip abduction and hip extension.  2X10 each bilat 4 way resisted ambulation with 20# cable pulley x3 each direction  Standing with blue power cord around ankle performing contralateral tap over/back x15 bilat Single leg stance 2x30 sec bilat   08/07/2024: Green band pallof press SLS: press out, press up, stir the pot each way: 5 reps each right/left  SLS 5# KB reach toward to the knee then transition to overhead press 10x right/left Leg Press (seat at 6)cues for patellofemoral alignment 75# 15x; 35# 20x single leg right/left  Gait pattern assessment (encouraged arm swing, shoe wear down evident) 4 way resisted ambulation with 15# cable pulley x5 forward, 20# backward, 15# side stepping 5x each (did 20# 3 reps pt wanted to challenge herself) Rockerboard ankle PF/DF Hip Matrix 45#: extension and abduction 10x each right/left (may increase weight next time)   08/05/2024: Nustep level 5 x6 min with PT present to discuss status Standing hamstring stretch on power plate (stretch setting 30 Hz) 2x30 sec bilat FWD step up on bosu x10 bilat FWD lunge onto bosu x10 bilat Standing single leg heel raise 2x10 bilat Standing high march with 3# ankle weights x20 bilat Standing hip abduction at barre with 3# ankle weights 2x10 bilat Leg Press (seat at 7) 75# 2x8 Forward T x10 bilat 4 way resisted ambulation with 10# cable pulley x3 each direction (can go up in weight next visit)    PATIENT EDUCATION:  Education details: Issued HEP Person educated: Patient Education method: Explanation, Demonstration, and Handouts Education comprehension: verbalized understanding and returned demonstration  HOME EXERCISE PROGRAM:  Access Code: [email or web address] URL: [email or web address] Date: 07/22/2024 Prepared by: Glade Pesa  Exercises - Seated Table Hamstring Stretch  - 1 x  daily - 7 x weekly - 4 reps - 20 sec hold - Quadriceps Stretch with Chair  - 1 x daily - 7 x weekly - 4 reps - 20 sec hold - Seated Long Arc Quad  - 1 x daily - 7 x weekly - 2 sets - 10 reps - Seated Long Arc Quad (Mirrored)  - 1 x daily - 7 x weekly - 2 sets - 10 reps - Gastroc Stretch on Wall  - 1 x daily - 7 x weekly - 4 reps - 20 sec hold - Heel Toe Raises with Counter Support  - 1 x daily - 7 x weekly - 2 sets - 10 reps - Standing Hip Abduction with Counter Support  - 1 x daily - 7 x weekly - 2 sets - 10 reps - Standing Hip Abduction with Counter Support (Mirrored)  - 1 x daily - 7 x weekly - 2 sets - 10 reps - Standing Hip Extension with Counter Support  - 1 x daily - 7 x weekly - 2 sets - 10 reps - Standing Hip Extension with Counter Support (Mirrored)  - 1 x daily - 7 x weekly - 2 sets - 10 reps - Standing Knee Flexion with Counter Support  - 1 x daily - 7 x weekly - 2 sets - 10 reps - Standing Knee Flexion with Counter Support (Mirrored)  - 1 x daily - 7 x weekly - 2 sets - 10 reps - Standing Single Leg Heel Raise  - 1 x daily - 7 x weekly - 1 sets - 10 reps - Single Leg Balance with Clock Reach  - 1 x daily - 7 x weekly - 3  sets - 5 reps - The Diver  - 1 x daily - 7 x weekly - 3 sets - 5 reps  ASSESSMENT:  CLINICAL IMPRESSION:  Melana presents to skilled PT reporting that she is going to the [facility] and feels that she is ready for discharge today.  Patient with great improvement noted in Lower Extremity Functional Status.  Additionally, she was able to navigate stairs with reciprocal pattern.  Patient with great participation throughout.  Patient has met all long term goals at this time and has a membership to the [facility].  Patient is ready from discharge at this time to continue with HEP and exercise at the [facility].  OBJECTIVE IMPAIRMENTS: difficulty walking, decreased ROM, decreased strength, increased muscle spasms, impaired flexibility, and pain.   ACTIVITY LIMITATIONS: carrying,  lifting, standing, stairs, and locomotion level  PARTICIPATION LIMITATIONS: cleaning and community activity  PERSONAL FACTORS: Time since onset of injury/illness/exacerbation and 1-2 comorbidities: ADHD, left knee arthroscopic surgery in 2006 are also affecting patient's functional outcome.   REHAB POTENTIAL: Good  CLINICAL DECISION MAKING: Stable/uncomplicated  EVALUATION COMPLEXITY: Low   GOALS: Goals reviewed with patient? Yes  SHORT TERM GOALS: Target date: 07/24/2024 Patient will be independent with initial HEP. Baseline: Goal status: Met on 07/15/24  2.  Patient will participate in a 6 minute walk test to establish baseline. Baseline:  Goal status: Met on 07/07/2024  3.  Patient will increase right knee extension A/ROM to -5 degrees from full extension. Baseline:  Goal status: Met on 07/15/24   LONG TERM GOALS: Target date: 08/23/2024  Patient will be independent with advanced HEP to allow for self progression after discharge. Baseline:  Goal status: Met on 08/11/24  2.  Patient will improve Lower Extremity Functional Scale (LEFS) to at least 70% to demonstrate improved functional mobility. Baseline: 42.5% Goal status: Met on 08/11/2024  3.  Patient will increase bilateral LE strength to [facility] to allow her to navigate stairs with reciprocal pattern. Baseline:  Goal status: Met on 08/11/24  4.  Patient will report being able to ambulate greater than 20 minutes without increased knee pain. Baseline:  Goal status: Met (can walk for for greater than 45 min without increased pain)  5.  Patient will improve right knee A/ROM to [facility] to allow her to demonstrate improved knee mechanics during stairs and ambulation. Baseline: -10 to 126 degrees Goal status: Met on 08/11/24     PLAN:  PT FREQUENCY: 2x/week  PT DURATION: 8 weeks  PLANNED INTERVENTIONS: 97164- PT Re-evaluation, 97750- Physical Performance Testing, 97110-Therapeutic exercises, 97530- Therapeutic  activity, 97112- Neuromuscular re-education, 97535- Self Care, 02859- Manual therapy, [phone]- Gait training, [phone]- Canalith repositioning, [id number]- Aquatic Therapy, [phone]- Electrical stimulation (unattended), [phone]- Electrical stimulation (manual), [id number]- Vasopneumatic device, [id number]- Ultrasound, [id number]- Traction (mechanical), [id number]- Ionotophoresis 4mg /ml Dexamethasone, [id number] (1-2 muscles), [id number] (3+ muscles)- Dry Needling, Patient/Family education, Balance training, Stair training, Taping, Joint mobilization, Joint manipulation, Spinal manipulation, Spinal mobilization, Vestibular training, Cryotherapy, and Moist heat   PHYSICAL THERAPY DISCHARGE SUMMARY  Visits from Start of Care: 10  Current functional level related to goals / functional outcomes: See above   Remaining deficits: Patient still has some knee pain, but has improved greatly since initial eval   Education / Equipment: Continue HEP and going to the [facility] [employer]   Patient agrees to discharge. Patient goals were met. Patient is being discharged due to meeting the stated rehab goals.    Jarrell Laming, PT, DPT 08/11/24, 11:59 AM  [facility] [address]  [address] [place], [place] [address] Phone # [phone] Fax [phone]

## 2024-08-12 ENCOUNTER — Ambulatory Visit: Admitting: Rehabilitative and Restorative Service Providers"

## 2024-08-26 ENCOUNTER — Telehealth (HOSPITAL_COMMUNITY): Payer: Self-pay | Admitting: Psychiatry

## 2024-08-26 NOTE — Telephone Encounter (Signed)
 Patient called to get an appointment with a new provider after Dr. Mercy. Patient has been out of medication for a few days. She has an appointment with Dr. Homer on 12/19 at 9 am. She would like to know if there is a way to bridge her medication until her appointment next week. Please advise. Thank you.

## 2024-08-28 NOTE — Telephone Encounter (Signed)
 Contacted and spoke with patient regarding request for medication refills.  The patient dispense history was reviewed, which reflects standing refills for the patient's medications Contacted pharmacy to confirm they were available for patient to pick up today.  Lindsey Palecek Carrin Carrero, MD PGY-3, [facility] Health Psychiatry

## 2024-09-04 ENCOUNTER — Encounter (HOSPITAL_COMMUNITY): Payer: Self-pay | Admitting: Student in an Organized Health Care Education/Training Program

## 2024-09-04 ENCOUNTER — Ambulatory Visit (INDEPENDENT_AMBULATORY_CARE_PROVIDER_SITE_OTHER): Admitting: Student in an Organized Health Care Education/Training Program

## 2024-09-04 VITALS — BP 122/74 | HR 80 | Ht 66.0 in | Wt 192.0 lb

## 2024-09-04 DIAGNOSIS — F900 Attention-deficit hyperactivity disorder, predominantly inattentive type: Secondary | ICD-10-CM | POA: Diagnosis not present

## 2024-09-04 DIAGNOSIS — F3341 Major depressive disorder, recurrent, in partial remission: Secondary | ICD-10-CM | POA: Diagnosis not present

## 2024-09-04 MED ORDER — METHYLPHENIDATE HCL 5 MG PO TABS: 5.0000 mg | ORAL_TABLET | Freq: Every day | ORAL | 0 refills | Status: AC

## 2024-09-04 MED ORDER — TRAZODONE HCL 150 MG PO TABS: 75.0000 mg | ORAL_TABLET | Freq: Every evening | ORAL | 1 refills | Status: AC | PRN

## 2024-09-04 MED ORDER — VENLAFAXINE HCL ER 150 MG PO CP24: 150.0000 mg | ORAL_CAPSULE | Freq: Every day | ORAL | 1 refills | Status: AC

## 2024-09-04 MED ORDER — METHYLPHENIDATE HCL ER 18 MG PO TB24: 18.0000 mg | ORAL_TABLET | Freq: Every morning | ORAL | 0 refills | Status: AC

## 2024-09-04 NOTE — Progress Notes (Signed)
 [facility] MD Outpatient Progress Note  09/04/2024 1:35 PM Lindsey Avila  MRN:  [id number]  Assessment:  Lindsey Avila presents for follow-up evaluation on 09/04/2024   Mood symptoms remain stable. She continues to experience clinically significant inattention and executive dysfunction impacting work performance. She initiated Concerta  18 mg daily last month without perceived benefit. Given limited response at this dose, a short trial of Ritalin  has been initiated as a short-acting agent to optimize symptom coverage. Will reassess efficacy and tolerability and consider Concerta  titration at the next visit.   Identifying Information: Lindsey Avila is a 48 y.o. female with a history of MDD, anxiety, ADHD inattentive type, OSA, and Vitamin D  deficiency who is an established patient with [facility].   Plan:  # ADHD inattentive type Supported by neuropsychiatric testing at [facility] June 2023 Past medication trials: Vyvanse , Adderall (dry mouth/eyes), Wellbutrin  (allergic reaction), Qelbree, Strattera (blurred vision) Status of problem: New to me Interventions: -- Continue Concerta  18 mg daily -- Start Ritalin  5 mg daily in afternoon to optimize symptom coverage at the day -- PDMP reviewed on 09/03/2024 -- Continue individual psychotherapy with [facility]  # MDD in partial remission Past medication trials: Lexapro  (irritable), Celexa (nightmares), naltrexone -bupropion  (allergic), Xanax , hydroxyzine Status of problem: stable Interventions: -- Continue Effexor  150 mg daily (i7/9/25) -- Continue trazodone  75 mg nightly (takes half tablet of 150 mg)  # High risk medication use Interventions: Adderall: -- Vitals obtained at PCP appointment 02/18/24: BP 112/75 HR 76 -- EKG 06/20/20 NSR -- UDS appropriately positive for amphetamines on 10/29   Health Maintenance PCP: Job Lukes, P Weight loss- tirzepititde   Patient was given contact  information for behavioral health clinic and was instructed to call 911 for emergencies.   Subjective:  Chief Complaint:  Chief Complaint  Patient presents with   Follow-up    Interval History:   The patient reports being very satisfied with venlafaxine  and states she has minimal symptoms of anxiety and depression. She describes ongoing difficulties with inattention and executive functioning, noting low motivation to initiate tasks and reports that these symptoms are affecting her performance at work. She reports uncertainty regarding the benefit of methylphenidate  extended release, which was started in November by Dr. Mercy, and states she has not perceived a clear improvement in attention. She reports a prior trial of Adderall XR that she found ineffective. She describes sleeping approximately seven hours per night but reports not feeling rested and states she is not using CPAP due to claustrophobia. She reports adherence to her medications as prescribed and denies adverse effects. She reports no recent substance use, including alcohol, tobacco, or cannabis. She denies suicidal ideation, homicidal ideation, and auditory or visual hallucinations.  Visit Diagnosis:    ICD-10-CM   1. Attention deficit hyperactivity disorder (ADHD), predominantly inattentive type  F90.0 methylphenidate  18 MG PO CR tablet    methylphenidate  (RITALIN ) 5 MG tablet    methylphenidate  (RITALIN ) 5 MG tablet    methylphenidate  18 MG PO CR tablet    2. MDD (major depressive disorder), recurrent, in partial remission  F33.41 venlafaxine  XR (EFFEXOR -XR) 150 MG 24 hr capsule    traZODone  (DESYREL ) 150 MG tablet       Past Psychiatric History:  Diagnoses: MDD; anxiety; ADHD inattentive type (supported by neuropsychiatric testing at [facility] June 2023) Medication trials: Vyvanse , Adderall (dry mouth/eyes), Wellbutrin  (allergic reaction), [place]ette, Strattera (blurred vision); Lexapro  (irritable),  Celexa (nightmares), naltrexone -bupropion  (allergic), Xanax , hydroxyzine, trazodone   Hospitalizations: denies  Suicide attempts: denies SIB: denies Hx of violence towards others: denies Current access to guns: denies Substance use:  -- Etoh: rarely; once every few months -- Denies use of tobacco, or illicit drugs including cannabis  Past Medical History:  Past Medical History:  Diagnosis Date   ADHD (attention deficit hyperactivity disorder) 05/2020   Allergy    Anemia    in remote past due to heavy periods   Anxiety    Chronic back pain    Depression 2009   prior with pregnancy   Encounter for routine gynecological examination    Dr. Marjorie Gull   GERD (gastroesophageal reflux disease)    IUD (intrauterine device) in place    IUD replaced 08/13/2018 with Mirena ; Dr. Marjorie Gull   Obesity    Recurrent urinary tract infection    no prior urology consult   Seasonal allergies    Skin anomaly    keratinosis   Wears glasses     Past Surgical History:  Procedure Laterality Date   CESAREAN SECTION  2009   KNEE ARTHROSCOPY     left, arthroscopic    Family Psychiatric History:  Mother: depression Dad: alcohol use disorder Maternal grandfather: alcohol use disorder Denies family history of sudden cardiac death or cardiac issues prior to 50 yo  Family History:  Family History  Problem Relation Age of Onset   Hypertension Mother    Hypothyroidism Mother    Obesity Mother    Macular degeneration Mother    Cataracts Mother    Cancer Mother        skin cancer   Asthma Mother    Depression Mother    Hypertension Father    Cancer Father        basal cell skin   Benign prostatic hyperplasia Father    Alcohol abuse Father    Obesity Maternal Uncle    Dementia Maternal Grandfather    Alcohol abuse Maternal Grandfather    COPD Maternal Grandmother    Asthma Maternal Grandmother    Heart disease Paternal Grandfather        MI   Hypertension Paternal Grandfather     Pancreatic cancer Paternal Grandmother    Cancer Paternal Grandmother        pancreatic   Stroke Neg Hx    Diabetes Neg Hx    Colon cancer Neg Hx    Esophageal cancer Neg Hx    Rectal cancer Neg Hx    Stomach cancer Neg Hx     Social History:  Academic/Vocational: 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;  works as best boy  Social History   Socioeconomic History   Marital status: Divorced    Spouse name: Not on file   Number of children: Not on file   Years of education: Not on file   Highest education level: Not on file  Occupational History   Not on file  Tobacco Use   Smoking status: Former    Current packs/day: 0.00    Types: Cigarettes    Quit date: 09/17/1998    Years since quitting: 25.9   Smokeless tobacco: Never   Tobacco comments:    Socially and infrequently  Vaping Use   Vaping status: Never Used  Substance and Sexual Activity   Alcohol use: Yes    Alcohol/week: 1.0 standard drink of alcohol    Types: 1 Glasses of wine per week    Comment: Very infrequent - once every few months   Drug use:  No   Sexual activity: Not Currently    Birth control/protection: I.U.D.  Other Topics Concern   Not on file  Social History Narrative   Lives with 12yo son, cat and dog.     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    Exercise - kickboxing, but prior  with treadmill, cardio, circuit training.   05/2020   Social Drivers of Health   Tobacco Use: Medium Risk (09/04/2024)   Patient History    Smoking Tobacco Use: Former    Smokeless Tobacco Use: Never    Passive Exposure: Not on file  Financial Resource Strain: Medium Risk (12/06/2023)   Overall Financial Resource Strain (CARDIA)    Difficulty of Paying Living Expenses: Somewhat hard  Food Insecurity: Low Risk (07/20/2024)   Received from [facility]   Epic    Within the past 12 months, you worried that your food would run out before you got money to buy more:  Never true    Within the past 12 months, the food you bought just didn't last and you didn't have money to get more. : Never true  Transportation Needs: No Transportation Needs (07/20/2024)   Received from [facility]    In the past 12 months, has lack of reliable transportation kept you from medical appointments, meetings, work or from getting things needed for daily living? : No  Physical Activity: Inactive (12/06/2023)   Exercise Vital Sign    Days of Exercise per Week: 0 days    Minutes of Exercise per Session: 0 min  Stress: Stress Concern Present (12/06/2023)   [employer] of Occupational Health - Occupational Stress Questionnaire    Feeling of Stress : To some extent  Social Connections: Socially Isolated (12/06/2023)   Social Connection and Isolation Panel    Frequency of Communication with Friends and Family: More than three times a week    Frequency of Social Gatherings with Friends and Family: Three times a week    Attends Religious Services: Never    Active Member of Clubs or Organizations: No    Attends Banker Meetings: Never    Marital Status: Divorced  Depression (PHQ2-9): Low Risk (09/04/2024)   Depression (PHQ2-9)    PHQ-2 Score: 0  Alcohol Screen: Low Risk (12/06/2023)   Alcohol Screen    Last Alcohol Screening Score (AUDIT): 1  Housing: Medium Risk (07/20/2024)   Received from [facility]   Epic    What is your living situation today?: I have a steady place to live    Think about the place you live. Do you have problems with any of the following? Choose all that apply:: Lead paint or pipes;Oven or stove not working;Pests such as bugs, ants, or mice  Utilities: Low Risk (07/20/2024)   Received from [facility]   Utilities    In the past 12 months has the electric, gas, oil, or water company threatened to shut off services in your home? : No  Health Literacy: Adequate Health Literacy (12/06/2023)   B1300 Health Literacy     Frequency of need for help with medical instructions: Never    Allergies:  Allergies  Allergen Reactions   Sulfa Antibiotics Anaphylaxis and Hives   Bupropion  Hives   Contrave  [Naltrexone -Bupropion  Hcl Er] Hives   Latex    Penicillins     unknown    Current Medications: Current Outpatient Medications  Medication Sig Dispense Refill   methylphenidate  (  RITALIN ) 5 MG tablet Take 1 tablet (5 mg total) by mouth daily. 30 tablet 0   [START ON 10/02/2024] methylphenidate  (RITALIN ) 5 MG tablet Take 1 tablet (5 mg total) by mouth daily. 30 tablet 0   Cholecalciferol 125 MCG (5000 UT) TABS Take 5,000 Units by mouth daily. (Patient not taking: Reported on 07/02/2024)     EPINEPHrine  0.3 mg/0.3 mL IJ SOAJ injection Inject 0.3 mg into the muscle as needed for anaphylaxis. 2 each 0   FIBER ADULT GUMMIES PO Take 2 each by mouth daily in the afternoon.     levonorgestrel  (MIRENA , 52 MG,) 20 MCG/24HR IUD Mirena  20 mcg/24 hours (6 yrs) 52 mg intrauterine device  Take 1 device by intrauterine route.     methylphenidate  18 MG PO CR tablet Take 1 tablet (18 mg total) by mouth in the morning. 30 tablet 0   methylphenidate  18 MG PO CR tablet Take 1 tablet (18 mg total) by mouth in the morning. 30 tablet 0   [START ON 10/02/2024] methylphenidate  18 MG PO CR tablet Take 1 tablet (18 mg total) by mouth in the morning. 30 tablet 0   Theanine 200 MG CAPS Take 1 capsule by mouth daily.     traZODone  (DESYREL ) 150 MG tablet Take 0.5 tablets (75 mg total) by mouth at bedtime as needed for sleep. 15 tablet 0   traZODone  (DESYREL ) 150 MG tablet Take 0.5 tablets (75 mg total) by mouth at bedtime as needed for sleep. 45 tablet 1   venlafaxine  XR (EFFEXOR -XR) 150 MG 24 hr capsule Take 1 capsule (150 mg total) by mouth daily. 30 capsule 0   venlafaxine  XR (EFFEXOR -XR) 150 MG 24 hr capsule Take 1 capsule (150 mg total) by mouth daily. 90 capsule 1   WEGOVY 1 MG/0.5ML SOAJ Inject 1 mg into the skin once a week.     No  current facility-administered medications for this visit.    ROS: See above  Objective:  Psychiatric Specialty Exam: Blood pressure 122/74, pulse 80, height 5' 6 (1.676 m), weight 192 lb (87.1 kg).Body mass index is 30.99 kg/m.  General Appearance: Casual and Well Groomed  Eye Contact:  Good  Speech:  Clear and Coherent and Normal Rate  Volume:  Normal  Mood:  fine  Affect:  Euthymic; engaged; Tax Inspector Content: Denies AVH; no overt delusional thought content on interview   Suicidal Thoughts:  No  Homicidal Thoughts:  No  Thought Process:  Goal Directed and Linear  Orientation:  Full (Time, Place, and Person)    Memory:  Grossly intact   Judgment:  Good  Insight:  Good  Concentration:  Concentration: Fair  Recall:  not formally assessed   Fund of Knowledge: Good  Language: Good  Psychomotor Activity:  Normal  Akathisia:  No  AIMS (if indicated): NA  Assets:  Communication Skills Desire for Improvement Housing Leisure Time Physical Health Resilience Social Support Talents/Skills Transportation Vocational/Educational  ADL's:  Intact  Cognition: WNL  Sleep:  Fair   PE: General: sits comfortably in view of camera; no acute distress  Pulm: no increased work of breathing on room air  MSK: all extremity movements appear intact  Neuro: no focal neurological deficits observed  Gait & Station: unable to assess by video    Metabolic Disorder Labs: Lab Results  Component Value Date   HGBA1C 4.8 05/18/2022   MPG 91 08/23/2017   MPG 94 10/28/2014   No results found for: PROLACTIN Lab Results  Component Value  Date   CHOL 162 05/18/2022   TRIG 77 05/18/2022   HDL 48 05/18/2022   CHOLHDL 4 06/23/2021   VLDL 17.4 06/23/2021   LDLCALC 99 05/18/2022   LDLCALC 108 (H) 06/23/2021   Lab Results  Component Value Date   TSH 1.90 05/18/2022   TSH 3.37 11/02/2021    Therapeutic Level Labs: No results found for: LITHIUM No results found for:  VALPROATE No results found for: CBMZ  Screenings:  GAD-7    Flowsheet Row Clinical Support from 09/04/2024 in [facility] Office Visit from 12/13/2023 in [facility] Counselor from 12/06/2023 in [facility] Office Visit from 06/23/2021 in [facility]  Total GAD-7 Score 1 12 14 10    PHQ2-9    Flowsheet Row Clinical Support from 09/04/2024 in [facility] Office Visit from 12/13/2023 in [facility] Counselor from 12/06/2023 in [facility] Video Visit from 09/20/2021 in [facility] Office Visit from 06/23/2021 in [facility]  PHQ-2 Total Score 0 2 6 0 2  PHQ-9 Total Score -- 12 17 -- 12   Flowsheet Row Office Visit from 12/13/2023 in [facility] Counselor from 12/06/2023 in [facility]  C-SSRS RISK CATEGORY No Risk No Risk    Review of neuropsychological testing from [facility] 02/22/22: Per report summary: Neuropsychiatric questionnaire shows severe problems with attention and memory. Objective testing with Qb results showed statistically significant abnormalities in attention and activity.  Collaboration of Care: Collaboration of Care: Medication Management AEB active medication management, Psychiatrist AEB established with this provider, and Referral or follow-up with counselor/therapist AEB established with individual psychotherapy  Patient/Guardian was advised Release of Information must be obtained prior to any record release in order to collaborate their care with an outside provider. Patient/Guardian was advised if they have not already done so to contact the registration department to sign all necessary forms in order for us  to release  information regarding their care.   Consent: Patient/Guardian gives verbal consent for treatment and assignment of benefits for services provided during this visit. Patient/Guardian expressed understanding and agreed to proceed.    I provided 30 minutes dedicated to the care of this patient via video on the date of this encounter to include chart review, face-to-face time with the patient, medication management/counseling, documentation.  Marlo Masson, MD 09/04/2024, 1:35 PM

## 2024-10-27 ENCOUNTER — Ambulatory Visit: Admitting: Physician Assistant

## 2024-10-29 ENCOUNTER — Telehealth (HOSPITAL_COMMUNITY): Payer: Self-pay | Admitting: Student in an Organized Health Care Education/Training Program

## 2024-11-26 ENCOUNTER — Ambulatory Visit: Admitting: Dermatology
